# Patient Record
Sex: Female | Born: 1985 | Race: Black or African American | Hispanic: No | Marital: Single | Smoking: Never smoker
Health system: Southern US, Community
[De-identification: ages and names within clinical notes are randomized; demographics above are authoritative.]

## PROBLEM LIST (undated history)

## (undated) HISTORY — PX: CHOLECYSTECTOMY: SHX55

---

## 2007-02-20 ENCOUNTER — Emergency Department: Payer: Self-pay | Admitting: Emergency Medicine

## 2009-02-01 ENCOUNTER — Emergency Department: Payer: Self-pay | Admitting: Emergency Medicine

## 2010-07-23 ENCOUNTER — Emergency Department: Payer: Self-pay | Admitting: Emergency Medicine

## 2010-08-02 ENCOUNTER — Emergency Department: Payer: Self-pay | Admitting: Emergency Medicine

## 2010-08-05 ENCOUNTER — Emergency Department: Payer: Self-pay | Admitting: Emergency Medicine

## 2012-07-28 ENCOUNTER — Ambulatory Visit: Payer: Self-pay

## 2012-09-08 ENCOUNTER — Ambulatory Visit: Payer: Self-pay | Admitting: Gynecologic Oncology

## 2012-09-08 LAB — HCG, QUANTITATIVE, PREGNANCY: Beta Hcg, Quant.: <1 m[IU]/mL — ABNORMAL LOW

## 2012-09-11 LAB — PATHOLOGY REPORT

## 2012-09-18 ENCOUNTER — Ambulatory Visit: Payer: Self-pay | Admitting: Gynecologic Oncology

## 2012-10-18 ENCOUNTER — Ambulatory Visit: Payer: Self-pay | Admitting: Gynecologic Oncology

## 2012-10-27 ENCOUNTER — Ambulatory Visit: Payer: Self-pay | Admitting: Gynecologic Oncology

## 2012-10-27 LAB — CBC
HCT: 36.3 % (ref 35.0–47.0)
MCHC: 33.1 g/dL (ref 32.0–36.0)
MCV: 89 fL (ref 80–100)
Platelet: 258 10*3/uL (ref 150–440)
RBC: 4.08 10*6/uL (ref 3.80–5.20)
RDW: 12.5 % (ref 11.5–14.5)
WBC: 7.4 10*3/uL (ref 3.6–11.0)

## 2012-11-03 ENCOUNTER — Ambulatory Visit: Payer: Self-pay | Admitting: Gynecologic Oncology

## 2012-11-04 LAB — PATHOLOGY REPORT

## 2012-11-18 ENCOUNTER — Ambulatory Visit: Payer: Self-pay | Admitting: Gynecologic Oncology

## 2012-11-19 LAB — URINALYSIS, COMPLETE
Glucose,UR: NEGATIVE mg/dL (ref 0–75)
Ketone: NEGATIVE
Ph: 6 (ref 4.5–8.0)
Protein: NEGATIVE
RBC,UR: 2 /HPF (ref 0–5)
Specific Gravity: 1.026 (ref 1.003–1.030)
Squamous Epithelial: 2

## 2012-11-21 LAB — URINE CULTURE

## 2012-12-19 ENCOUNTER — Ambulatory Visit: Payer: Self-pay | Admitting: Gynecologic Oncology

## 2013-01-16 ENCOUNTER — Ambulatory Visit: Payer: Self-pay | Admitting: Gynecologic Oncology

## 2013-07-01 ENCOUNTER — Ambulatory Visit: Payer: Self-pay | Admitting: Gynecologic Oncology

## 2013-07-19 ENCOUNTER — Ambulatory Visit: Payer: Self-pay | Admitting: Gynecologic Oncology

## 2014-02-07 ENCOUNTER — Emergency Department: Payer: Self-pay | Admitting: Emergency Medicine

## 2014-02-07 LAB — CBC WITH DIFFERENTIAL/PLATELET
BASOS ABS: 0.1 10*3/uL (ref 0.0–0.1)
Basophil %: 1 %
EOS ABS: 0.1 10*3/uL (ref 0.0–0.7)
Eosinophil %: 1.3 %
HCT: 41 % (ref 35.0–47.0)
HGB: 13.7 g/dL (ref 12.0–16.0)
LYMPHS ABS: 2.7 10*3/uL (ref 1.0–3.6)
LYMPHS PCT: 32.9 %
MCH: 29.8 pg (ref 26.0–34.0)
MCHC: 33.5 g/dL (ref 32.0–36.0)
MCV: 89 fL (ref 80–100)
MONO ABS: 0.4 x10 3/mm (ref 0.2–0.9)
Monocyte %: 4.7 %
NEUTROS ABS: 4.9 10*3/uL (ref 1.4–6.5)
Neutrophil %: 60.1 %
PLATELETS: 248 10*3/uL (ref 150–440)
RBC: 4.61 10*6/uL (ref 3.80–5.20)
RDW: 13 % (ref 11.5–14.5)
WBC: 8.2 10*3/uL (ref 3.6–11.0)

## 2014-02-07 LAB — COMPREHENSIVE METABOLIC PANEL
ALK PHOS: 70 U/L
Albumin: 4.1 g/dL (ref 3.4–5.0)
Anion Gap: 7 (ref 7–16)
BUN: 9 mg/dL (ref 7–18)
Bilirubin,Total: 0.4 mg/dL (ref 0.2–1.0)
Calcium, Total: 9 mg/dL (ref 8.5–10.1)
Chloride: 105 mmol/L (ref 98–107)
Co2: 25 mmol/L (ref 21–32)
Creatinine: 0.67 mg/dL (ref 0.60–1.30)
Glucose: 120 mg/dL — ABNORMAL HIGH (ref 65–99)
Osmolality: 274 (ref 275–301)
POTASSIUM: 3.6 mmol/L (ref 3.5–5.1)
SGOT(AST): 17 U/L (ref 15–37)
SGPT (ALT): 21 U/L (ref 12–78)
Sodium: 137 mmol/L (ref 136–145)
Total Protein: 8.2 g/dL (ref 6.4–8.2)

## 2014-02-07 LAB — LIPASE, BLOOD: Lipase: 100 U/L (ref 73–393)

## 2014-09-19 ENCOUNTER — Emergency Department: Payer: Self-pay | Admitting: Emergency Medicine

## 2014-09-19 LAB — URINALYSIS, COMPLETE
BILIRUBIN, UR: NEGATIVE
BLOOD: NEGATIVE
Bilirubin,UR: NEGATIVE
Blood: NEGATIVE
Glucose,UR: NEGATIVE mg/dL (ref 0–75)
Glucose,UR: NEGATIVE mg/dL (ref 0–75)
KETONE: NEGATIVE
KETONE: NEGATIVE
NITRITE: NEGATIVE
Nitrite: NEGATIVE
PROTEIN: NEGATIVE
Ph: 6 (ref 4.5–8.0)
Ph: 5 (ref 4.5–8.0)
Protein: NEGATIVE
RBC,UR: 5 /HPF (ref 0–5)
RBC,UR: 2 /HPF (ref 0–5)
SPECIFIC GRAVITY: 1.008 (ref 1.003–1.030)
Specific Gravity: 1.006 (ref 1.003–1.030)
Squamous Epithelial: 12

## 2014-09-19 LAB — BASIC METABOLIC PANEL
ANION GAP: 8 (ref 7–16)
BUN: 9 mg/dL (ref 7–18)
CALCIUM: 9 mg/dL (ref 8.5–10.1)
CREATININE: 0.69 mg/dL (ref 0.60–1.30)
Chloride: 105 mmol/L (ref 98–107)
Co2: 28 mmol/L (ref 21–32)
EGFR (African American): >60
GLUCOSE: 96 mg/dL (ref 65–99)
OSMOLALITY: 280 (ref 275–301)
Potassium: 3.7 mmol/L (ref 3.5–5.1)
Sodium: 141 mmol/L (ref 136–145)

## 2014-09-19 LAB — CBC
HCT: 38.4 % (ref 35.0–47.0)
HGB: 12.3 g/dL (ref 12.0–16.0)
MCH: 28.5 pg (ref 26.0–34.0)
MCHC: 32 g/dL (ref 32.0–36.0)
MCV: 89 fL (ref 80–100)
PLATELETS: 275 10*3/uL (ref 150–440)
RBC: 4.3 10*6/uL (ref 3.80–5.20)
RDW: 13.2 % (ref 11.5–14.5)
WBC: 10.3 10*3/uL (ref 3.6–11.0)

## 2014-09-21 LAB — URINE CULTURE

## 2015-03-07 NOTE — Op Note (Signed)
PATIENT NAME:  Sarah Mooney, Sarah Mooney MR#:  [id number] DATE OF BIRTH:  1986-09-18  DATE OF PROCEDURE:  11/03/2012  SURGEON:  Dr. Hyacinth MeekerMiller.   PREOPERATIVE DIAGNOSIS:  CIN 3 of exocervix.   POSTOPERATIVE DIAGNOSIS:  CIN 3 of the exocervix.  PROCEDURE PERFORMED:  Loop electrosurgical excision procedure.  INDICATION FOR SURGERY:  The patient is a 29 year old patient who presented with a history of abnormal Pap smear and on a colposcopy-directed biopsy, was noted to have severe dysplasia of the exocervix. The endocervical curettage was negative. Therefore, a decision was made to proceed with an LEEP procedure. The remainder of the pelvic exam had been unremarkable.   FINDINGS AT TIME OF SURGERY:  Normal external genitalia, urethral meatus, urethra, bladder and vagina. Cervix with a central iodine-negative area extending a little bit to the posterior lip. Remainder of the pelvic exam unremarkable.   OPERATIVE REPORT:  After adequate general anesthesia had been obtained, the patient was prepped and draped in high lithotomy position. A speculum was inserted and the cervix was visualized. Lugol's staining was obtained. Then, using the medium-sized loop, the central portion of the cervix with the transformation zone was removed. One small bleeding vessel on the posterior lip was cauterized.   The patient tolerated the procedure well and was taken to recovery room in stable condition. Pad, sponge, needle and instrument count were correct x2.     ____________________________ Maxine GlennBrigitte E. Alee Gressman, MD bem:es D: 11/03/2012 16:42:15 ET T: 11/04/2012 09:59:55 ET JOB#: [id number]  cc: Maxine GlennBrigitte E. Sierria Bruney, MD, <Dictator> Maxine GlennBRIGITTE E Marli Diego MD ELECTRONICALLY SIGNED 11/10/2012 10:02

## 2015-10-27 DIAGNOSIS — F419 Anxiety disorder, unspecified: Secondary | ICD-10-CM | POA: Insufficient documentation

## 2015-10-27 DIAGNOSIS — Z8742 Personal history of other diseases of the female genital tract: Secondary | ICD-10-CM | POA: Insufficient documentation

## 2015-10-27 DIAGNOSIS — E669 Obesity, unspecified: Secondary | ICD-10-CM | POA: Insufficient documentation

## 2017-04-20 ENCOUNTER — Emergency Department
Admission: EM | Admit: 2017-04-20 | Discharge: 2017-04-20 | Disposition: A | Discharge status: Home / Self Care | Payer: Self-pay | Attending: Emergency Medicine | Admitting: Emergency Medicine

## 2017-04-20 DIAGNOSIS — J029 Acute pharyngitis, unspecified: Secondary | ICD-10-CM | POA: Insufficient documentation

## 2017-04-20 LAB — POCT RAPID STREP A: Streptococcus, Group A Screen (Direct): NEGATIVE

## 2017-04-20 MED ORDER — IBUPROFEN 800 MG PO TABS: 800.0000 mg | ORAL_TABLET | Freq: Three times a day (TID) | ORAL | 0 refills | Status: DC | PRN

## 2017-04-20 MED ORDER — LIDOCAINE VISCOUS 2 % MT SOLN
15.0000 mL | Freq: Once | OROMUCOSAL | Status: AC
  Administered 2017-04-20: 15 mL via OROMUCOSAL
  Filled 2017-04-20: qty 15

## 2017-04-20 MED ORDER — MAGIC MOUTHWASH: 15.0000 mL | Freq: Three times a day (TID) | ORAL | 0 refills | Status: DC | PRN

## 2017-04-20 NOTE — Discharge Instructions (Signed)
Please return to the emergency department if you develop severe pain, fever, severe headache, drooling or shortness of breath, or any other symptoms concerning to you.

## 2017-04-20 NOTE — ED Provider Notes (Signed)
[facility] Emergency Department Provider Note  ____________________________________________  Time seen: Approximately 7:57 PM  I have reviewed the triage vital signs and the nursing notes.   HISTORY  Chief Complaint Sore Throat    HPI Sarah Mooney is a 31 y.o. female, otherwise healthy, presenting with sore throat since Tuesday. The patient has pain with swallowing but is able to swallow her saliva. She has not had any associated cough, stiff neck or neck pain, rhinorrhea or congestion, ear pain, fever or chills. She has tried Chloraseptic and Tylenol with minimal relief. No shortness breath.    No past medical history on file.  There are no active problems to display for this patient.   No past surgical history on file.    Allergies Latex  No family history on file.  Social History Social History  Substance Use Topics  . Smoking status: Not on file  . Smokeless tobacco: Not on file  . Alcohol use Not on file    Review of Systems Constitutional: No fever/chills.No lightheadedness or syncope. Eyes: No visual changes. No eye discharge for ENT: Positive sore throat without drooling or shortness of breath. No congestion or rhinorrhea. No ear pain. Cardiovascular: Denies chest pain. Denies palpitations. Respiratory: Denies shortness of breath.  No cough. Gastrointestinal: No abdominal pain.  No nausea, no vomiting.  No diarrhea.  No constipation. Genitourinary: Negative for dysuria. Musculoskeletal: Negative for back pain. Skin: Negative for rash. Neurological: Negative for headaches. No focal numbness, tingling or weakness.     ____________________________________________   PHYSICAL EXAM:  VITAL SIGNS: ED Triage Vitals  Enc Vitals Group     BP 04/20/17 1847 133/79     Pulse Rate 04/20/17 1847 92     Resp 04/20/17 1847 16     Temp 04/20/17 1847 98.6 F (37 C)     Temp Source 04/20/17 1847 Oral     SpO2 04/20/17 1847 100 %     Weight 04/20/17 1847 180 lb (81.6 kg)     Height 04/20/17 1847 5\' 3"  (1.6 m)     Head Circumference --      Peak Flow --      Pain Score 04/20/17 1849 9     Pain Loc --      Pain Edu? --      Excl. in GC? --     Constitutional: Alert and oriented. Well appearing and in no acute distress. Answers questions appropriately. Eyes: Conjunctivae are normal.  EOMI. No scleral icterus.No eye discharge. EARS: TMs are clear without erythema bulge or fluid bilaterally. The canals are clear as well. Head: Atraumatic. Nose: No congestion/rhinnorhea. Mouth/Throat: Mucous membranes are moist. Minimal posterior pharyngeal erythema. Posterior palate is symmetric and uvula is midline. Tonsils are not enlarged, no exudate. Neck: No stridor.  Supple.  No JVD. No meningismus. Cardiovascular: Normal rate, regular rhythm. No murmurs, rubs or gallops.  Respiratory: Normal respiratory effort.  No accessory muscle use or retractions. Lungs CTAB.  No wheezes, rales or ronchi. Gastrointestinal: Soft, nontender and nondistended.  No guarding or rebound.  No peritoneal signs. Musculoskeletal: No LE edema.  Neurologic:  A&Ox3.  Speech is clear.  Face and smile are symmetric.  EOMI.  Moves all extremities well. Skin:  Skin is warm, dry and intact. No rash noted. Psychiatric: Mood and affect are normal. Speech and behavior are normal.  Normal judgement.  ____________________________________________   LABS (all labs ordered are listed, but only abnormal results are displayed)  Labs Reviewed  CULTURE, GROUP A STREP [facility])  POCT RAPID STREP A   ____________________________________________  EKG  Not indicated ____________________________________________  RADIOLOGY  No results found.  ____________________________________________   PROCEDURES  Procedure(s) performed: None  Procedures  Critical Care performed: No ____________________________________________   INITIAL IMPRESSION / ASSESSMENT AND  PLAN / ED COURSE  Pertinent labs & imaging results that were available during my care of the patient were reviewed by me and considered in my medical decision making (see chart for details).  31 y.o. female, otherwise healthy, presenting with 5 days of sore throat without any other cystitis symptoms. The patient has a negative strep test. On my examination, I do see some mild erythema, which is consistent with a pharyngitis, likely viral. I do not see any evidence of tonsillar abscess or peritonsillar abscess, and there is no clinical evidence for epiglottitis, tracheitis, meningitis, or other more severe infection or process today. There is no evidence of airway obstruction. I'll plan to treat the patient symptomatically and have her follow-up with her primary care physician. Return precautions as well as follow-up instructions were discussed.  ____________________________________________  FINAL CLINICAL IMPRESSION(S) / ED DIAGNOSES  Final diagnoses:  Pharyngitis, unspecified etiology         NEW MEDICATIONS STARTED DURING THIS VISIT:  New Prescriptions   IBUPROFEN (ADVIL,MOTRIN) 800 MG TABLET    Take 1 tablet (800 mg total) by mouth every 8 (eight) hours as needed.   MAGIC MOUTHWASH SOLN    Take 15 mLs by mouth 3 (three) times daily as needed for mouth pain.      Rockne Menghini, MD 04/20/17 2003

## 2017-04-20 NOTE — ED Triage Notes (Signed)
Pt reports sore throat since Tuesday, states pain cont to get worse, hurts to swallow

## 2017-04-24 LAB — CULTURE, GROUP A STREP (THRC)

## 2017-10-22 LAB — HM HIV SCREENING LAB: HM HIV Screening: NEGATIVE

## 2017-10-22 LAB — HM PAP SMEAR: HM Pap smear: NEGATIVE

## 2018-02-23 ENCOUNTER — Other Ambulatory Visit: Payer: Self-pay

## 2018-02-23 ENCOUNTER — Encounter: Payer: Self-pay | Admitting: *Deleted

## 2018-02-23 ENCOUNTER — Emergency Department
Admission: EM | Admit: 2018-02-23 | Discharge: 2018-02-23 | Disposition: A | Discharge status: Home / Self Care | Payer: Self-pay | Attending: Emergency Medicine | Admitting: Emergency Medicine

## 2018-02-23 DIAGNOSIS — Z79899 Other long term (current) drug therapy: Secondary | ICD-10-CM | POA: Insufficient documentation

## 2018-02-23 DIAGNOSIS — R55 Syncope and collapse: Secondary | ICD-10-CM | POA: Insufficient documentation

## 2018-02-23 DIAGNOSIS — Z91040 Latex allergy status: Secondary | ICD-10-CM | POA: Insufficient documentation

## 2018-02-23 DIAGNOSIS — R112 Nausea with vomiting, unspecified: Secondary | ICD-10-CM | POA: Insufficient documentation

## 2018-02-23 DIAGNOSIS — R197 Diarrhea, unspecified: Secondary | ICD-10-CM

## 2018-02-23 LAB — CBC
HEMATOCRIT: 42 % (ref 35.0–47.0)
HEMOGLOBIN: 13.4 g/dL (ref 12.0–16.0)
MCH: 27.9 pg (ref 26.0–34.0)
MCHC: 31.9 g/dL — ABNORMAL LOW (ref 32.0–36.0)
MCV: 87.5 fL (ref 80.0–100.0)
Platelets: 330 10*3/uL (ref 150–440)
RBC: 4.8 MIL/uL (ref 3.80–5.20)
RDW: 13 % (ref 11.5–14.5)
WBC: 9.4 10*3/uL (ref 3.6–11.0)

## 2018-02-23 LAB — URINALYSIS, COMPLETE (UACMP) WITH MICROSCOPIC
BACTERIA UA: NONE SEEN
Bilirubin Urine: NEGATIVE
GLUCOSE, UA: NEGATIVE mg/dL
Ketones, ur: 5 mg/dL — AB
LEUKOCYTES UA: NEGATIVE
NITRITE: NEGATIVE
Protein, ur: NEGATIVE mg/dL
SPECIFIC GRAVITY, URINE: 1.024 (ref 1.005–1.030)
pH: 5 (ref 5.0–8.0)

## 2018-02-23 LAB — COMPREHENSIVE METABOLIC PANEL
ALT: 17 U/L (ref 14–54)
AST: 17 U/L (ref 15–41)
Albumin: 4.6 g/dL (ref 3.5–5.0)
Alkaline Phosphatase: 72 U/L (ref 38–126)
Anion gap: 7 (ref 5–15)
BILIRUBIN TOTAL: 0.7 mg/dL (ref 0.3–1.2)
BUN: 9 mg/dL (ref 6–20)
CO2: 28 mmol/L (ref 22–32)
Calcium: 9.7 mg/dL (ref 8.9–10.3)
Chloride: 104 mmol/L (ref 101–111)
Creatinine, Ser: 0.74 mg/dL (ref 0.44–1.00)
GFR calc Af Amer: >60 mL/min (ref >60)
Glucose, Bld: 91 mg/dL (ref 65–99)
POTASSIUM: 3.4 mmol/L — AB (ref 3.5–5.1)
Sodium: 139 mmol/L (ref 135–145)
TOTAL PROTEIN: 8.4 g/dL — AB (ref 6.5–8.1)

## 2018-02-23 LAB — LIPASE, BLOOD: Lipase: 24 U/L (ref 11–51)

## 2018-02-23 MED ORDER — ONDANSETRON 4 MG PO TBDP: 4.0000 mg | ORAL_TABLET | Freq: Once | ORAL | Status: DC | PRN

## 2018-02-23 MED ORDER — IBUPROFEN 400 MG PO TABS
400.0000 mg | ORAL_TABLET | Freq: Once | ORAL | Status: AC | PRN
  Administered 2018-02-23: 400 mg via ORAL
  Filled 2018-02-23: qty 1

## 2018-02-23 MED ORDER — METOCLOPRAMIDE HCL 5 MG/ML IJ SOLN
10.0000 mg | Freq: Once | INTRAMUSCULAR | Status: AC
  Administered 2018-02-23: 10 mg via INTRAMUSCULAR
  Filled 2018-02-23: qty 2

## 2018-02-23 NOTE — ED Provider Notes (Signed)
[facility] Emergency Department Provider Note ____________________________________________   First MD Initiated Contact with Patient 02/23/18 1659     (approximate)  I have reviewed the triage vital signs and the nursing notes.   HISTORY  Chief Complaint Emesis and Headache    HPI Sarah Mooney is a 32 y.o. female who presents with multiple complaints, but primarily describing an episode of near syncope last night.  Patient states that she initially started having diarrhea yesterday evening, and then felt discomfort in her stomach.  She went to the bathroom again, and put her head and arms on the sink while she was on the toilet.  Patient states that she felt very lightheaded and weak, and felt like she was going to pass out.  She states that after this she had an episode of vomiting, and then developed urticaria which are now resolved after Benadryl.  Patient states that the nausea and vomiting have resolved, she has some abdominal discomfort but no pain, and no diarrhea today.  She primarily reports a frontal throbbing headache.  No sick contacts, recent illness, or antibiotic use.  No unusual foods, but the patient states that the symptoms started after eating pizza.   History reviewed. No pertinent past medical history.  There are no active problems to display for this patient.   History reviewed. No pertinent surgical history.  Prior to Admission medications   Medication Sig Start Date End Date Taking? Authorizing Provider  ibuprofen (ADVIL,MOTRIN) 800 MG tablet Take 1 tablet (800 mg total) by mouth every 8 (eight) hours as needed. 04/20/17   Rockne Menghini, MD  magic mouthwash SOLN Take 15 mLs by mouth 3 (three) times daily as needed for mouth pain. 04/20/17   Rockne Menghini, MD    Allergies Latex  History reviewed. No pertinent family history.  Social History Social History   Tobacco Use  . Smoking status: Never Smoker  .  Smokeless tobacco: Never Used  Substance Use Topics  . Alcohol use: Not Currently  . Drug use: Not Currently    Review of Systems  Constitutional: No fever. Eyes: No redness. ENT: No sore throat. Cardiovascular: Denies chest pain. Respiratory: Denies shortness of breath. Gastrointestinal: Positive for resolved diarrhea.  Genitourinary: Negative for dysuria.  Musculoskeletal: Negative for back pain. Skin: Positive for urticaria. Neurological: Positive for headache.   ____________________________________________   PHYSICAL EXAM:  VITAL SIGNS: ED Triage Vitals  Enc Vitals Group     BP 02/23/18 1609 118/72     Pulse Rate 02/23/18 1609 92     Resp 02/23/18 1609 20     Temp 02/23/18 1609 98.6 F (37 C)     Temp Source 02/23/18 1609 Oral     SpO2 02/23/18 1609 98 %     Weight 02/23/18 1612 200 lb (90.7 kg)     Height 02/23/18 1612 5\' 2"  (1.575 m)     Head Circumference --      Peak Flow --      Pain Score 02/23/18 1609 9     Pain Loc --      Pain Edu? --      Excl. in GC? --     Constitutional: Alert and oriented. Well appearing and in no acute distress. Eyes: Conjunctivae are normal.  EOMI.  PERRLA. Head: Atraumatic. Nose: No congestion/rhinnorhea. Mouth/Throat: Mucous membranes are moist.   Neck: Normal range of motion.  No meningeal signs. Cardiovascular: Normal rate, regular rhythm. Grossly normal heart sounds.  Good peripheral  circulation. Respiratory: Normal respiratory effort.  No retractions. Lungs CTAB. Gastrointestinal: Soft and nontender. No distention.  Genitourinary: No flank tenderness. Musculoskeletal:  Extremities warm and well perfused.  Neurologic:  Normal speech and language. No gross focal neurologic deficits are appreciated.  Skin:  Skin is warm and dry. No rash noted. Psychiatric: Mood and affect are normal. Speech and behavior are normal.  ____________________________________________   LABS (all labs ordered are listed, but only abnormal  results are displayed)  Labs Reviewed  COMPREHENSIVE METABOLIC PANEL - Abnormal; Notable for the following components:      Result Value   Potassium 3.4 (*)    Total Protein 8.4 (*)    All other components within normal limits  CBC - Abnormal; Notable for the following components:   MCHC 31.9 (*)    All other components within normal limits  URINALYSIS, COMPLETE (UACMP) WITH MICROSCOPIC - Abnormal; Notable for the following components:   Color, Urine YELLOW (*)    APPearance CLOUDY (*)    Hgb urine dipstick SMALL (*)    Ketones, ur 5 (*)    Squamous Epithelial / LPF 0-5 (*)    All other components within normal limits  LIPASE, BLOOD  POC URINE PREG, ED   ____________________________________________  EKG  ED ECG REPORT I, Dionne BucySebastian Rosemary Mossbarger, the attending physician, personally viewed and interpreted this ECG.  Date: 02/23/2018 EKG Time: 1801 Rate: 61 Rhythm: normal sinus rhythm QRS Axis: normal Intervals: normal ST/T Wave abnormalities: normal Narrative Interpretation: no evidence of acute ischemia  ____________________________________________  RADIOLOGY    ____________________________________________   PROCEDURES  Procedure(s) performed: No  Procedures  Critical Care performed: No ____________________________________________   INITIAL IMPRESSION / ASSESSMENT AND PLAN / ED COURSE  Pertinent labs & imaging results that were available during my care of the patient were reviewed by me and considered in my medical decision making (see chart for details).  32 year old female presents with vomiting and diarrhea last night, with an associated episode of near syncope and hives which have since resolved.  Patient reports a frontal headache at this time, and denies other symptoms.  On exam, the patient is very well-appearing, vital signs are normal, and the remainder the exam is as described above.  Neuro exam is nonfocal, abdomen is soft and nontender, and the  mucous membranes are moist.  Presentation is consistent with vasovagal near syncope related to either gastroenteritis or foodborne illness.  The headache was likely precipitated by the syncope, but differential also includes mild dehydration or migraine.   Initial labs are reassuring.  We will obtain an EKG and give IM reglan for symptomatic treatment.    ----------------------------------------- 6:26 PM on 02/23/2018 -----------------------------------------  EKG is within normal limits.  Patient reports feeling slightly better after the Reglan.  She feels well to go home.  Return precautions given, and the patient expresses understanding.  ____________________________________________   FINAL CLINICAL IMPRESSION(S) / ED DIAGNOSES  Final diagnoses:  Near syncope  Nausea vomiting and diarrhea      NEW MEDICATIONS STARTED DURING THIS VISIT:  New Prescriptions   No medications on file     Note:  This document was prepared using Dragon voice recognition software and may include unintentional dictation errors.    Dionne BucySiadecki, Dunia Pringle, MD 02/23/18 [phone]

## 2018-02-23 NOTE — ED Notes (Signed)
Pt reports hives, diarrhea yesterday.  Pt took a benadryl with good results yesterday.  No hives today.  Pt has a headache today.  No n/v/  No diarrhea today.  Pt drinking water now.  Pt alert.  Speech clear.

## 2018-02-23 NOTE — ED Triage Notes (Signed)
Pt states vomiting starting 2100 yesterday. Pt states she vomited x 4. Pt states diarrhea x 2 prior to beginning vomiting. Pt c/o headache and abdominal pain, presently. Has had no recurrence of diarrhea or vomiting. Pt states able to keep water down, but she still feels dry-mouthed. Pt denies fever and dysuria.

## 2018-02-23 NOTE — Discharge Instructions (Addendum)
Drink plenty of fluids and start to gradually advance your diet over the next few days.  Return to the ER for new, worsening, or persistent severe headache, vomiting, fevers, weakness, or any other new or worsening symptoms that concern you.  Follow-up with your primary care doctor.

## 2018-02-23 NOTE — ED Notes (Signed)
POCT preg test negative.

## 2018-12-27 DIAGNOSIS — J029 Acute pharyngitis, unspecified: Secondary | ICD-10-CM | POA: Insufficient documentation

## 2018-12-27 DIAGNOSIS — Z91040 Latex allergy status: Secondary | ICD-10-CM | POA: Insufficient documentation

## 2018-12-28 ENCOUNTER — Emergency Department
Admission: EM | Admit: 2018-12-28 | Discharge: 2018-12-28 | Disposition: A | Discharge status: Home / Self Care | Payer: Self-pay | Attending: Emergency Medicine | Admitting: Emergency Medicine

## 2018-12-28 ENCOUNTER — Other Ambulatory Visit: Payer: Self-pay

## 2018-12-28 DIAGNOSIS — J029 Acute pharyngitis, unspecified: Secondary | ICD-10-CM

## 2018-12-28 LAB — GROUP A STREP BY PCR: GROUP A STREP BY PCR: NOT DETECTED

## 2018-12-28 MED ORDER — DEXAMETHASONE 4 MG PO TABS
12.0000 mg | ORAL_TABLET | Freq: Once | ORAL | Status: AC
  Administered 2018-12-28: 12 mg via ORAL
  Filled 2018-12-28: qty 3

## 2018-12-28 MED ORDER — IBUPROFEN 600 MG PO TABS: 600.0000 mg | ORAL_TABLET | Freq: Four times a day (QID) | ORAL | 0 refills | Status: DC | PRN

## 2018-12-28 NOTE — ED Provider Notes (Signed)
[facility] Emergency Department Provider Note ____________________________________________   First MD Initiated Contact with Patient 12/28/18 0236     (approximate)  I have reviewed the triage vital signs and the nursing notes.   HISTORY  Chief Complaint Sore Throat    HPI Sarah Mooney is a 33 y.o. female with no significant PMH who presents with sore throat for 1 week, gradual onset, persistent course, not relieved by Robitussin at home.  She reports associated nasal congestion and a subjective fever but no cough.  She reports pain on swallowing but no difficulty swallowing.  No past medical history on file.  There are no active problems to display for this patient.   No past surgical history on file.  Prior to Admission medications   Medication Sig Start Date End Date Taking? Authorizing Provider  ibuprofen (ADVIL,MOTRIN) 600 MG tablet Take 1 tablet (600 mg total) by mouth every 6 (six) hours as needed. 12/28/18   Dionne Bucy, MD  magic mouthwash SOLN Take 15 mLs by mouth 3 (three) times daily as needed for mouth pain. 04/20/17   Rockne Menghini, MD    Allergies Latex  No family history on file.  Social History Social History   Tobacco Use  . Smoking status: Never Smoker  . Smokeless tobacco: Never Used  Substance Use Topics  . Alcohol use: Not Currently  . Drug use: Not Currently    Review of Systems  Constitutional: Positive for fever. Eyes: No redness. ENT: Positive for sore throat. Cardiovascular: Denies chest pain. Respiratory: Denies shortness of breath. Gastrointestinal: No vomiting or diarrhea.  Genitourinary: Negative for flank pain.  Musculoskeletal: Negative for back pain. Skin: Negative for rash. Neurological: Negative for headache.   ____________________________________________   PHYSICAL EXAM:  VITAL SIGNS: ED Triage Vitals [12/28/18 0001]  Enc Vitals Group     BP 123/78     Pulse Rate 79       Resp 16     Temp 98.4 F (36.9 C)     Temp Source Oral     SpO2 100 %     Weight 200 lb (90.7 kg)     Height 5' (1.524 m)     Head Circumference      Peak Flow      Pain Score 10     Pain Loc      Pain Edu?      Excl. in GC?     Constitutional: Alert and oriented. Well appearing and in no acute distress. Eyes: Conjunctivae are normal.  Head: Atraumatic. Nose: No congestion/rhinnorhea. Mouth/Throat: Mucous membranes are moist.  Tonsils slightly swollen and erythematous with no exudate.  No stridor. Neck: Normal range of motion.  No significant lymphadenopathy. Cardiovascular:  Good peripheral circulation. Respiratory: Normal respiratory effort.   Gastrointestinal: No distention.  Musculoskeletal: Extremities warm and well perfused.  Neurologic:  Normal speech and language. No gross focal neurologic deficits are appreciated.  Skin:  Skin is warm and dry. No rash noted. Psychiatric: Mood and affect are normal. Speech and behavior are normal.  ____________________________________________   LABS (all labs ordered are listed, but only abnormal results are displayed)  Labs Reviewed  GROUP A STREP BY PCR   ____________________________________________  EKG   ____________________________________________  RADIOLOGY    ____________________________________________   PROCEDURES  Procedure(s) performed: No  Procedures  Critical Care performed: No ____________________________________________   INITIAL IMPRESSION / ASSESSMENT AND PLAN / ED COURSE  Pertinent labs & imaging results that were available during  my care of the patient were reviewed by me and considered in my medical decision making (see chart for details).  33 year old female with no significant PMH presents with sore throat for the last week.  Presentation is consistent with viral pharyngitis.  She is afebrile here.  Strep swab is negative.  We will treat symptomatically with Decadron and ibuprofen.   Return precautions given, and the patient expressed understanding.  She is stable for discharge home.     ____________________________________________   FINAL CLINICAL IMPRESSION(S) / ED DIAGNOSES  Final diagnoses:  Viral pharyngitis      NEW MEDICATIONS STARTED DURING THIS VISIT:  New Prescriptions   IBUPROFEN (ADVIL,MOTRIN) 600 MG TABLET    Take 1 tablet (600 mg total) by mouth every 6 (six) hours as needed.     Note:  This document was prepared using Dragon voice recognition software and may include unintentional dictation errors.    Dionne BucySiadecki, Atisha Hamidi, MD 12/28/18 [phone]

## 2018-12-28 NOTE — ED Notes (Signed)
Pt reports feeling like a needle is in her throat every time she swallows x 1 week; pt says she felt hot but never checked her temp; pt talking in complete sentences; in no acute distress

## 2018-12-28 NOTE — ED Triage Notes (Signed)
Pt with sore throat for a week. Pt with red swollen tonsils noted. Pt appears in no acute distress.

## 2018-12-28 NOTE — Discharge Instructions (Signed)
The dexamethasone you got in the ER today will help decrease the inflammation and pain around her tonsils.  You can take the prescribed ibuprofen every 6 hours, or if you already have ibuprofen (Advil, Motrin) at home you can take the over-the-counter version, 600 mg every 6 hours.  Return to the ER for new or worsening throat pain, difficulty swallowing, shortness of breath, or any other new or worsening symptoms that concern you.

## 2019-08-02 DIAGNOSIS — Z6833 Body mass index (BMI) 33.0-33.9, adult: Secondary | ICD-10-CM

## 2019-08-02 DIAGNOSIS — E669 Obesity, unspecified: Secondary | ICD-10-CM

## 2019-08-02 DIAGNOSIS — Z8742 Personal history of other diseases of the female genital tract: Secondary | ICD-10-CM

## 2019-08-03 ENCOUNTER — Other Ambulatory Visit: Payer: Self-pay

## 2019-08-03 ENCOUNTER — Encounter: Payer: Self-pay | Admitting: Family Medicine

## 2019-08-03 ENCOUNTER — Ambulatory Visit: Payer: Self-pay | Admitting: Family Medicine

## 2019-08-03 DIAGNOSIS — Z113 Encounter for screening for infections with a predominantly sexual mode of transmission: Secondary | ICD-10-CM

## 2019-08-03 LAB — WET PREP FOR TRICH, YEAST, CLUE
Clue Cell Exam: POSITIVE — AB
Trichomonas Exam: POSITIVE — AB
Yeast Exam: NEGATIVE

## 2019-08-03 MED ORDER — METRONIDAZOLE 500 MG PO TABS: 500.0000 mg | ORAL_TABLET | Freq: Two times a day (BID) | ORAL | 0 refills | Status: AC

## 2019-08-03 NOTE — Progress Notes (Signed)
Wet mount reviewed with provider. Patient treated for BV and Trich.Jenetta Downer, RN

## 2019-08-03 NOTE — Progress Notes (Signed)
Here today for STD screening. Declines bloodwork. Wesly Whisenant, RN ? ?

## 2019-08-03 NOTE — Progress Notes (Signed)
    STI clinic/screening visit  Subjective:  Sarah Mooney is a 33 y.o. female being seen today for an STI screening visit. The patient reports they do have symptoms.  Patient has the following medical conditions:   Patient Active Problem List   Diagnosis Date Noted  . Anxiety disorder 10/27/2015  . Obesity, unspecified 10/27/2015  . History of abnormal cervical Pap smear 10/27/2015     Chief Complaint  Patient presents with  . SEXUALLY TRANSMITTED DISEASE    HPI  Patient reports she has been experiencing a yellowish discharge with itching and irritation for the last week. States she did notice a weird odor soon after she noticed discharge. Denies pelvic pain or abnormal bleeding. Has not had sexual intercourse, but only oral sex 2 weeks ago.  See flowsheet for further details and programmatic requirements.    The following portions of the patient's history were reviewed and updated as appropriate: allergies, current medications, past medical history, past social history, past surgical history and problem list.  Objective:  There were no vitals filed for this visit.  Physical Exam Constitutional:      Appearance: Normal appearance.  HENT:     Mouth/Throat:     Mouth: Mucous membranes are moist.     Pharynx: Oropharynx is clear. No oropharyngeal exudate or posterior oropharyngeal erythema.  Neck:     Musculoskeletal: Normal range of motion and neck supple. No muscular tenderness.  Genitourinary:    General: Normal vulva.     Vagina: Vaginal discharge present.     Rectum: Normal.     Comments: Small amount of yellowish/tan discharge noted.pH<4.5 No cervical motion tenderness upon bimanual exam.  Lymphadenopathy:     Cervical: No cervical adenopathy.  Skin:    General: Skin is warm and dry.  Neurological:     Mental Status: She is alert and oriented to person, place, and time.  Psychiatric:        Mood and Affect: Mood normal.        Behavior: Behavior normal.         Judgment: Judgment normal.       Assessment and Plan:  Sarah Mooney is a 33 y.o. female presenting to the [facility] Department for STI screening  1. Screening examination for venereal disease - WET PREP FOR TRICH, YEAST, CLUE - Chlamydia/Gonorrhea [place] Treat wet prep for BV and Trich with metronidazole 500mg  PO BID x 7days. Counseled that partner will need to be treated as well.  No sexual activity x 7 days.  Oral/throat culture not obtained due to culture plate unavailability.  Interview and examination done by Harlow Mares, FNP student. I was consulted about this client's pan of care. CLatta FNP  No follow-ups on file.  No future appointments.  Hassell Done, FNP

## 2019-08-21 ENCOUNTER — Encounter: Payer: Self-pay | Admitting: Emergency Medicine

## 2019-08-21 ENCOUNTER — Other Ambulatory Visit: Payer: Self-pay

## 2019-08-21 ENCOUNTER — Emergency Department
Admission: EM | Admit: 2019-08-21 | Discharge: 2019-08-21 | Disposition: A | Discharge status: Home / Self Care | Payer: Self-pay | Attending: Student | Admitting: Student

## 2019-08-21 DIAGNOSIS — M79605 Pain in left leg: Secondary | ICD-10-CM

## 2019-08-21 DIAGNOSIS — M79604 Pain in right leg: Secondary | ICD-10-CM | POA: Insufficient documentation

## 2019-08-21 LAB — BASIC METABOLIC PANEL
Anion gap: 7 (ref 5–15)
BUN: 11 mg/dL (ref 6–20)
CO2: 25 mmol/L (ref 22–32)
Calcium: 9.1 mg/dL (ref 8.9–10.3)
Chloride: 105 mmol/L (ref 98–111)
Creatinine, Ser: 0.59 mg/dL (ref 0.44–1.00)
GFR calc Af Amer: >60 mL/min (ref >60)
GFR calc non Af Amer: >60 mL/min (ref >60)
Glucose, Bld: 88 mg/dL (ref 70–99)
Potassium: 3.6 mmol/L (ref 3.5–5.1)
Sodium: 137 mmol/L (ref 135–145)

## 2019-08-21 LAB — CBC WITH DIFFERENTIAL/PLATELET
Abs Immature Granulocytes: 0.03 10*3/uL (ref 0.00–0.07)
Basophils Absolute: 0.1 10*3/uL (ref 0.0–0.1)
Basophils Relative: 1 %
Eosinophils Absolute: 0.1 10*3/uL (ref 0.0–0.5)
Eosinophils Relative: 1 %
HCT: 40.4 % (ref 36.0–46.0)
Hemoglobin: 12.8 g/dL (ref 12.0–15.0)
Immature Granulocytes: 0 %
Lymphocytes Relative: 32 %
Lymphs Abs: 3.4 10*3/uL (ref 0.7–4.0)
MCH: 27.6 pg (ref 26.0–34.0)
MCHC: 31.7 g/dL (ref 30.0–36.0)
MCV: 87.3 fL (ref 80.0–100.0)
Monocytes Absolute: 0.8 10*3/uL (ref 0.1–1.0)
Monocytes Relative: 8 %
Neutro Abs: 6.2 10*3/uL (ref 1.7–7.7)
Neutrophils Relative %: 58 %
Platelets: 328 10*3/uL (ref 150–400)
RBC: 4.63 MIL/uL (ref 3.87–5.11)
RDW: 13.5 % (ref 11.5–15.5)
WBC: 10.6 10*3/uL — ABNORMAL HIGH (ref 4.0–10.5)
nRBC: 0 % (ref 0.0–0.2)

## 2019-08-21 MED ORDER — CYCLOBENZAPRINE HCL 10 MG PO TABS
10.0000 mg | ORAL_TABLET | Freq: Once | ORAL | Status: AC
  Administered 2019-08-21: 10 mg via ORAL
  Filled 2019-08-21: qty 1

## 2019-08-21 MED ORDER — IBUPROFEN 800 MG PO TABS
800.0000 mg | ORAL_TABLET | Freq: Once | ORAL | Status: AC
  Administered 2019-08-21: 800 mg via ORAL
  Filled 2019-08-21: qty 1

## 2019-08-21 MED ORDER — IBUPROFEN 800 MG PO TABS: 800.0000 mg | ORAL_TABLET | Freq: Three times a day (TID) | ORAL | 0 refills | Status: DC | PRN

## 2019-08-21 MED ORDER — CYCLOBENZAPRINE HCL 5 MG PO TABS: 5.0000 mg | ORAL_TABLET | Freq: Three times a day (TID) | ORAL | 0 refills | Status: DC | PRN

## 2019-08-21 NOTE — Discharge Instructions (Signed)
Take the prescription meds as directed. Follow-up with your provider or return to the ED for worsening symptoms, as discussed.

## 2019-08-21 NOTE — ED Triage Notes (Signed)
Bilateral knee pain x 2 weeks. Denies any sports activities or falls.

## 2019-08-22 NOTE — ED Provider Notes (Signed)
[facility] Emergency Department Provider Note ____________________________________________  Time seen: 1825  I have reviewed the triage vital signs and the nursing notes.  HISTORY  Chief Complaint  Knee Pain  HPI Sarah Mooney is a 33 y.o. female patient with complaints of pain at [facility] to the knees but to the anterior thigh of the right leg for the past 2 weeks.  She reports intermittent symptoms but denies any preceding injury, accident, or trauma.  Patient reports muscle pain and achiness to the anterior right thigh as well as some tenderness to the anterior shin.  She localizes pain to the patellofemoral junction on the right.  No frank knee complaints are reported patient denies any history of chronic ongoing knee pain.  She also denies any symptom management or previous evaluation  for complaints.  She is also denied any distal paresthesias, foot drop, incontinence, chest pain, shortness of breath.  She has had no headaches, visual disturbance, or unexplained weight loss.  History reviewed. No pertinent past medical history.  Patient Active Problem List   Diagnosis Date Noted  . Anxiety disorder 10/27/2015  . Obesity, unspecified 10/27/2015  . History of abnormal cervical Pap smear 10/27/2015    History reviewed. No pertinent surgical history.  Prior to Admission medications   Medication Sig Start Date End Date Taking? Authorizing Provider  cyclobenzaprine (FLEXERIL) 5 MG tablet Take 1 tablet (5 mg total) by mouth 3 (three) times daily as needed. 08/21/19   Elvis Laufer, Dannielle Karvonen, PA-C  ibuprofen (ADVIL) 800 MG tablet Take 1 tablet (800 mg total) by mouth every 8 (eight) hours as needed. 08/21/19   Masey Scheiber, Dannielle Karvonen, PA-C    Allergies Latex  Family History  Problem Relation Age of Onset  . Diabetes Mother   . Hypertension Mother   . Heart disease Mother   . Heart attack Father   . Diabetes Father   . Hypertension Father   . Cancer  Maternal Grandmother   . Cancer Maternal Grandfather   . Heart attack Paternal Grandmother     Social History Social History   Tobacco Use  . Smoking status: Never Smoker  . Smokeless tobacco: Never Used  Substance Use Topics  . Alcohol use: Not Currently  . Drug use: Not Currently    Review of Systems  Constitutional: Negative for fever. Eyes: Negative for visual changes. ENT: Negative for sore throat. Cardiovascular: Negative for chest pain. Respiratory: Negative for shortness of breath. Gastrointestinal: Negative for abdominal pain, vomiting and diarrhea. Genitourinary: Negative for dysuria. Musculoskeletal: Negative for back pain.  Right thigh pain as above. Skin: Negative for rash. Neurological: Negative for headaches, focal weakness or numbness. ____________________________________________  PHYSICAL EXAM:  VITAL SIGNS: ED Triage Vitals  Enc Vitals Group     BP 08/21/19 1742 (!) 95/55     Pulse Rate 08/21/19 1742 89     Resp 08/21/19 1742 14     Temp 08/21/19 1742 97.7 F (36.5 C)     Temp Source 08/21/19 1742 Oral     SpO2 08/21/19 1742 97 %     Weight 08/21/19 1750 204 lb (92.5 kg)     Height 08/21/19 1750 5\' 2"  (1.575 m)     Head Circumference --      Peak Flow --      Pain Score 08/21/19 1750 10     Pain Loc --      Pain Edu? --      Excl. in [place]? --  Constitutional: Alert and oriented. Well appearing and in no distress. Head: Normocephalic and atraumatic. Eyes: Conjunctivae are normal. Normal extraocular movements Neck: Supple. Normal ROM without crepitus Cardiovascular: Normal rate, regular rhythm. Normal distal pulses. Respiratory: Normal respiratory effort. No wheezes/rales/rhonchi. Gastrointestinal: Soft and nontender. No distention. Musculoskeletal: Normal spinal alignment without midline tenderness, spasm, deformity, or step-off.  Patient with normal quadricep muscle contraction and normal leg extension.  There is no internal derangement  directed to the right knee.  No popliteal space fullness is noted.  No calf or Achilles and is noted distally.  Ankle exam is benign within normal limits.  Patient is able to demonstrate normal lumbar flexion and extension range on exam.  She has a solid hip and knee flexion range on exam.  Nontender with normal range of motion in all extremities.  Neurologic: Cranial nerves II through XII grossly intact.  Normal LE DTRs bilaterally.  Normal toe and heel walk on exam.  No cerebellar ataxia appreciated.  Normal gait without ataxia. Normal speech and language. No gross focal neurologic deficits are appreciated. Skin:  Skin is warm, dry and intact. No rash noted. ____________________________________________   LABS (pertinent positives/negatives) Labs Reviewed  CBC WITH DIFFERENTIAL/PLATELET - Abnormal; Notable for the following components:      Result Value   WBC 10.6 (*)    All other components within normal limits  BASIC METABOLIC PANEL  ____________________________________________  PROCEDURES  Flexeril 10 mg PO IBU 800 mg PO Procedures ____________________________________________  INITIAL IMPRESSION / ASSESSMENT AND PLAN / ED COURSE  Patient with ED evaluation of anterior right thigh pain over the last 2 weeks.  Patient describes intermittent but persistent muscle pain without injury, swelling, or ecchymosis.  Her exam is reassuring as she is able to demonstrate normal flexion and extension range and no neuro muscle deficits are appreciated.  Her labs also did not reveal any acute infectious or premature processes.  Patient is encouraged to follow-up with primary provider for ongoing symptoms.  Prescriptions for ibuprofen cyclobenzaprine are provided.  She will follow-up as directed or return to the ED as needed.  Sarah Mooney was evaluated in Emergency Department on 08/22/2019 for the symptoms described in the history of present illness. She was evaluated in the context of the global  COVID-19 pandemic, which necessitated consideration that the patient might be at risk for infection with the SARS-CoV-2 virus that causes COVID-19. Institutional protocols and algorithms that pertain to the evaluation of patients at risk for COVID-19 are in a state of rapid change based on information released by regulatory bodies including the CDC and federal and state organizations. These policies and algorithms were followed during the patient's care in the ED. ____________________________________________  FINAL CLINICAL IMPRESSION(S) / ED DIAGNOSES  Final diagnoses:  Musculoskeletal pain of left lower extremity      Atharva Mirsky, Charlesetta Ivory, PA-C 08/22/19 2009    Miguel Aschoff., MD 08/23/19 [phone]

## 2019-09-24 ENCOUNTER — Other Ambulatory Visit: Payer: Self-pay | Admitting: *Deleted

## 2019-09-24 DIAGNOSIS — Z20822 Contact with and (suspected) exposure to covid-19: Secondary | ICD-10-CM

## 2019-09-25 LAB — NOVEL CORONAVIRUS, NAA: SARS-CoV-2, NAA: NOT DETECTED

## 2020-02-08 ENCOUNTER — Encounter: Payer: Self-pay | Admitting: Emergency Medicine

## 2020-02-08 ENCOUNTER — Other Ambulatory Visit: Payer: Self-pay

## 2020-02-08 DIAGNOSIS — N3000 Acute cystitis without hematuria: Secondary | ICD-10-CM | POA: Insufficient documentation

## 2020-02-08 DIAGNOSIS — Z79899 Other long term (current) drug therapy: Secondary | ICD-10-CM | POA: Insufficient documentation

## 2020-02-08 DIAGNOSIS — R000 Tachycardia, unspecified: Secondary | ICD-10-CM | POA: Insufficient documentation

## 2020-02-08 LAB — CBC WITH DIFFERENTIAL/PLATELET
Abs Immature Granulocytes: 0.03 10*3/uL (ref 0.00–0.07)
Basophils Absolute: 0.1 10*3/uL (ref 0.0–0.1)
Basophils Relative: 0 %
Eosinophils Absolute: 0.3 10*3/uL (ref 0.0–0.5)
Eosinophils Relative: 2 %
HCT: 39.9 % (ref 36.0–46.0)
Hemoglobin: 12.7 g/dL (ref 12.0–15.0)
Immature Granulocytes: 0 %
Lymphocytes Relative: 38 %
Lymphs Abs: 4.3 10*3/uL — ABNORMAL HIGH (ref 0.7–4.0)
MCH: 28.1 pg (ref 26.0–34.0)
MCHC: 31.8 g/dL (ref 30.0–36.0)
MCV: 88.3 fL (ref 80.0–100.0)
Monocytes Absolute: 0.6 10*3/uL (ref 0.1–1.0)
Monocytes Relative: 6 %
Neutro Abs: 6 10*3/uL (ref 1.7–7.7)
Neutrophils Relative %: 54 %
Platelets: 326 10*3/uL (ref 150–400)
RBC: 4.52 MIL/uL (ref 3.87–5.11)
RDW: 13.3 % (ref 11.5–15.5)
WBC: 11.4 10*3/uL — ABNORMAL HIGH (ref 4.0–10.5)
nRBC: 0 % (ref 0.0–0.2)

## 2020-02-08 LAB — URINALYSIS, COMPLETE (UACMP) WITH MICROSCOPIC
Bilirubin Urine: NEGATIVE
Glucose, UA: NEGATIVE mg/dL
Hgb urine dipstick: NEGATIVE
Ketones, ur: NEGATIVE mg/dL
Nitrite: POSITIVE — AB
Protein, ur: 30 mg/dL — AB
Specific Gravity, Urine: 1.03 (ref 1.005–1.030)
WBC, UA: >50 WBC/hpf — ABNORMAL HIGH (ref 0–5)
pH: 5 (ref 5.0–8.0)

## 2020-02-08 LAB — COMPREHENSIVE METABOLIC PANEL
ALT: 19 U/L (ref 0–44)
AST: 18 U/L (ref 15–41)
Albumin: 4.4 g/dL (ref 3.5–5.0)
Alkaline Phosphatase: 65 U/L (ref 38–126)
Anion gap: 10 (ref 5–15)
BUN: 14 mg/dL (ref 6–20)
CO2: 25 mmol/L (ref 22–32)
Calcium: 9.3 mg/dL (ref 8.9–10.3)
Chloride: 104 mmol/L (ref 98–111)
Creatinine, Ser: 0.75 mg/dL (ref 0.44–1.00)
GFR calc Af Amer: >60 mL/min (ref >60)
GFR calc non Af Amer: >60 mL/min (ref >60)
Glucose, Bld: 147 mg/dL — ABNORMAL HIGH (ref 70–99)
Potassium: 3.4 mmol/L — ABNORMAL LOW (ref 3.5–5.1)
Sodium: 139 mmol/L (ref 135–145)
Total Bilirubin: 0.5 mg/dL (ref 0.3–1.2)
Total Protein: 8.1 g/dL (ref 6.5–8.1)

## 2020-02-08 LAB — TROPONIN I (HIGH SENSITIVITY): Troponin I (High Sensitivity): <2 ng/L (ref <18)

## 2020-02-08 LAB — TSH: TSH: 0.733 u[IU]/mL (ref 0.350–4.500)

## 2020-02-08 LAB — POCT PREGNANCY, URINE: Preg Test, Ur: NEGATIVE

## 2020-02-08 LAB — FIBRIN DERIVATIVES D-DIMER (ARMC ONLY): Fibrin derivatives D-dimer (ARMC): 172.71 ng/mL (FEU) (ref 0.00–499.00)

## 2020-02-08 NOTE — ED Triage Notes (Signed)
Pt to triage via w/c with no distress noted; st onset dizziness and sensation of heart racing this evening; denies any recent illness, denies hx of same, denies any other accomp symptoms

## 2020-02-09 ENCOUNTER — Emergency Department
Admission: EM | Admit: 2020-02-09 | Discharge: 2020-02-09 | Disposition: A | Discharge status: Home / Self Care | Payer: Self-pay | Attending: Emergency Medicine | Admitting: Emergency Medicine

## 2020-02-09 DIAGNOSIS — N3000 Acute cystitis without hematuria: Secondary | ICD-10-CM

## 2020-02-09 DIAGNOSIS — R000 Tachycardia, unspecified: Secondary | ICD-10-CM

## 2020-02-09 MED ORDER — CEPHALEXIN 500 MG PO CAPS: 500.0000 mg | ORAL_CAPSULE | Freq: Two times a day (BID) | ORAL | 0 refills | Status: AC

## 2020-02-09 NOTE — ED Provider Notes (Signed)
[facility] Emergency Department Provider Note  ____________________________________________   First MD Initiated Contact with Patient 02/09/20 0330     (approximate)  I have reviewed the triage vital signs and the nursing notes.   HISTORY  Chief Complaint Dizziness    HPI Sarah Mooney is a 34 y.o. female with below list of previous medical conditions presents to the emergency department secondary to rapid heartbeat that began at approximately 8 PM tonight.  Patient states that she noted her heart rate to be 134 at home.  Patient states that she felt dizzy and felt as though her heart was racing.  Patient denies any chest pain or shortness of breath with the incident.  Patient denies any recent illness.  Patient denies any nausea or vomiting.  Patient denies any personal familial history of thyroid disease.  Patient does admit to marijuana use this evening at 6 PM.  Patient denies any energy drink consumption.        History reviewed. No pertinent past medical history.  Patient Active Problem List   Diagnosis Date Noted  . Anxiety disorder 10/27/2015  . Obesity, unspecified 10/27/2015  . History of abnormal cervical Pap smear 10/27/2015    History reviewed. No pertinent surgical history.  Prior to Admission medications   Medication Sig Start Date End Date Taking? Authorizing Provider  cyclobenzaprine (FLEXERIL) 5 MG tablet Take 1 tablet (5 mg total) by mouth 3 (three) times daily as needed. 08/21/19   Menshew, Dannielle Karvonen, PA-C  ibuprofen (ADVIL) 800 MG tablet Take 1 tablet (800 mg total) by mouth every 8 (eight) hours as needed. 08/21/19   Menshew, Dannielle Karvonen, PA-C    Allergies Latex  Family History  Problem Relation Age of Onset  . Diabetes Mother   . Hypertension Mother   . Heart disease Mother   . Heart attack Father   . Diabetes Father   . Hypertension Father   . Cancer Maternal Grandmother   . Cancer Maternal Grandfather     . Heart attack Paternal Grandmother     Social History Social History   Tobacco Use  . Smoking status: Never Smoker  . Smokeless tobacco: Never Used  Substance Use Topics  . Alcohol use: Not Currently  . Drug use: Not Currently    Review of Systems Constitutional: No fever/chills Eyes: No visual changes. ENT: No sore throat. Cardiovascular: Denies chest pain. Respiratory: Denies shortness of breath. Gastrointestinal: No abdominal pain.  No nausea, no vomiting.  No diarrhea.  No constipation. Genitourinary: Negative for dysuria. Musculoskeletal: Negative for neck pain.  Negative for back pain. Integumentary: Negative for rash. Neurological: Negative for headaches, focal weakness or numbness.  ____________________________________________   PHYSICAL EXAM:  VITAL SIGNS: ED Triage Vitals  Enc Vitals Group     BP 02/08/20 2246 126/72     Pulse Rate 02/08/20 2246 (!) 126     Resp 02/08/20 2246 20     Temp 02/08/20 2246 98.3 F (36.8 C)     Temp Source 02/08/20 2246 Oral     SpO2 02/08/20 2246 99 %     Weight 02/08/20 2245 90.7 kg (200 lb)     Height 02/08/20 2245 1.549 m (5\' 1" )     Head Circumference --      Peak Flow --      Pain Score 02/08/20 2245 0     Pain Loc --      Pain Edu? --      Excl.  in [place]? --     Constitutional: Alert and oriented.  Eyes: Conjunctivae are normal.  Mouth/Throat: Patient is wearing a mask. Neck: No stridor.  No meningeal signs.   Cardiovascular: Normal rate, regular rhythm. Good peripheral circulation. Grossly normal heart sounds. Respiratory: Normal respiratory effort.  No retractions. Gastrointestinal: Soft and nontender. No distention.  Musculoskeletal: No lower extremity tenderness nor edema. No gross deformities of extremities. Neurologic:  Normal speech and language. No gross focal neurologic deficits are appreciated.  Skin:  Skin is warm, dry and intact. Psychiatric: Mood and affect are normal. Speech and behavior are  normal.  ____________________________________________   LABS (all labs ordered are listed, but only abnormal results are displayed)  Labs Reviewed  CBC WITH DIFFERENTIAL/PLATELET - Abnormal; Notable for the following components:      Result Value   WBC 11.4 (*)    Lymphs Abs 4.3 (*)    All other components within normal limits  COMPREHENSIVE METABOLIC PANEL - Abnormal; Notable for the following components:   Potassium 3.4 (*)    Glucose, Bld 147 (*)    All other components within normal limits  URINALYSIS, COMPLETE (UACMP) WITH MICROSCOPIC - Abnormal; Notable for the following components:   Color, Urine YELLOW (*)    APPearance HAZY (*)    Protein, ur 30 (*)    Nitrite POSITIVE (*)    Leukocytes,Ua MODERATE (*)    WBC, UA >50 (*)    Bacteria, UA MANY (*)    All other components within normal limits  TSH  FIBRIN DERIVATIVES D-DIMER ([facility] ONLY)  T4  POCT PREGNANCY, URINE  TROPONIN I (HIGH SENSITIVITY)   ____________________________________________  EKG  ED ECG REPORT I, Rathbun N Shandelle Borrelli, the attending physician, personally viewed and interpreted this ECG.   Date: 02/08/2020  EKG Time: 10:48 PM  Rate: 126  Rhythm: Sinus tachycardia  Axis: Normal  Intervals: Normal  ST&T Change: None  _______________ Procedures   ____________________________________________   INITIAL IMPRESSION / MDM / ASSESSMENT AND PLAN / ED COURSE  As part of my medical decision making, I reviewed the following data within the electronic MEDICAL RECORD NUMBER  33 year old female presented with above-stated history and physical exam secondary to rapid heartbeat patient noted to be in sinus tachycardia on arrival to the emergency department.  Without any medical intervention patient's current heart rate 92.  Laboratory data consistent with a urinary tract infection.  D-dimer normal glucose elevated at 147 patient advised to follow-up with primary care provider.  In addition urinalysis consistent with  a UTI for which patient will be prescribed Keflex.  Patient will be referred to cardiology for further outpatient evaluation.  ____________________________________________  FINAL CLINICAL IMPRESSION(S) / ED DIAGNOSES  Final diagnoses:  Acute cystitis without hematuria  Sinus tachycardia     MEDICATIONS GIVEN DURING THIS VISIT:  Medications - No data to display   ED Discharge Orders    None      *Please note:  Sarah Mooney was evaluated in Emergency Department on 02/09/2020 for the symptoms described in the history of present illness. She was evaluated in the context of the global COVID-19 pandemic, which necessitated consideration that the patient might be at risk for infection with the SARS-CoV-2 virus that causes COVID-19. Institutional protocols and algorithms that pertain to the evaluation of patients at risk for COVID-19 are in a state of rapid change based on information released by regulatory bodies including the CDC and federal and state organizations. These policies and algorithms were followed  during the patient's care in the ED.  Some ED evaluations and interventions may be delayed as a result of limited staffing during the pandemic.*  Note:  This document was prepared using Dragon voice recognition software and may include unintentional dictation errors.   Darci Current, MD 02/09/20 [phone]

## 2020-02-10 LAB — T4: T4, Total: 7.4 ug/dL (ref 4.5–12.0)

## 2020-03-29 ENCOUNTER — Ambulatory Visit (LOCAL_COMMUNITY_HEALTH_CENTER): Payer: Self-pay | Admitting: Advanced Practice Midwife

## 2020-03-29 ENCOUNTER — Encounter: Payer: Self-pay | Admitting: Advanced Practice Midwife

## 2020-03-29 ENCOUNTER — Other Ambulatory Visit: Payer: Self-pay

## 2020-03-29 VITALS — BP 113/72 | Ht 62.0 in | Wt 205.6 lb

## 2020-03-29 DIAGNOSIS — Z3009 Encounter for other general counseling and advice on contraception: Secondary | ICD-10-CM

## 2020-03-29 DIAGNOSIS — Z8742 Personal history of other diseases of the female genital tract: Secondary | ICD-10-CM

## 2020-03-29 LAB — WET PREP FOR TRICH, YEAST, CLUE
Trichomonas Exam: NEGATIVE
Yeast Exam: NEGATIVE

## 2020-03-29 NOTE — Progress Notes (Signed)
Patient here today for PE and STD screening. Last PE 2018, last CBE 2016. Last Pap 10/22/2017. Pap negative, HPV negative.Burt Knack, RN

## 2020-03-29 NOTE — Progress Notes (Signed)
Wet mount reviewed, no treatment indicated..Leelan Rajewski Brewer-Jensen, RN 

## 2020-03-29 NOTE — Progress Notes (Signed)
Family Planning Visit- Initial Visit  Subjective:  Sarah Mooney is a 34 y.o. SBF nullip exsmoker (last Black & Mild 02/2020). No obstetric history on file.   being seen today for an initial well woman visit and to discuss family planning options.  She is currently using none for pregnancy prevention. Patient reports she does not want a pregnancy in the next year.  Patient has the following medical conditions has Anxiety disorder; Obesity, BMI=37.6; and History of abnormal cervical Pap smear 05/01/12 HSIL mod/severe dysplasia on their problem list.  Chief Complaint  Patient presents with  . Annual Exam  . Exposure to STD    Patient reports LMP 01/29/20.  Last sex 03/22/20 without condom.  Last MJ 02/2020.  Last ETOH 03/22/20 (1 shot [place]).  1 sex partner in last 3 mo.  Last PE 2018.  Last CBE 2016.  Last pap 10/22/2017 neg HPV neg.  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  Colpo 09/08/12 ASCUS with 4 bx with HGSIL.  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  10/27/15 neg.  10/22/17 neg HPV neg.  Patient denies need for birth control.   Body mass index is 37.6 kg/m. - Patient is eligible for diabetes screening based on BMI and age >24?  not applicable HA1C ordered? not applicable  Patient reports 1 of partners in last year. Desires STI screening?  Yes  Has patient been screened once for HCV in the past?  Yes  No results found for: HCVAB  Does the patient have current of drug use, have a partner with drug use, and/or has been incarcerated since last result? Yes  If yes-- Screen for HCV through [facility]   Does the patient meet criteria for HBV testing? Yes  Criteria:  -Household, sexual or needle sharing contact with HBV -History of drug use -HIV positive -Those with known Hep C   Health Maintenance Due  Topic Date Due  . COVID-19 Vaccine (1) Never done  . TETANUS/TDAP  Never done    Review of Systems  All other systems reviewed and are negative.   The following portions of  the patient's history were reviewed and updated as appropriate: allergies, current medications, past family history, past medical history, past social history, past surgical history and problem list. Problem list updated.   See flowsheet for other program required questions.  Objective:   Vitals:   03/29/20 1441  BP: 113/72  Weight: 205 lb 9.6 oz (93.3 kg)  Height: 5\' 2"  (1.575 m)    Physical Exam Constitutional:      Appearance: Normal appearance. She is obese.  HENT:     Head: Normocephalic and atraumatic.     Mouth/Throat:     Mouth: Mucous membranes are moist.  Eyes:     Conjunctiva/sclera: Conjunctivae normal.  Cardiovascular:     Rate and Rhythm: Normal rate and regular rhythm.  Pulmonary:     Effort: Pulmonary effort is normal.     Breath sounds: Normal breath sounds.  Chest:     Breasts:        Right: Normal.        Left: Normal.  Abdominal:     Palpations: Abdomen is soft.     Comments: Soft, poor tone, without tenderness, increased adipose  Genitourinary:    General: Normal vulva.     Exam position: Lithotomy position.     Vagina: Vaginal discharge (white creamy leukorrhea, ph<4.5) present.     Cervix: Normal.  Uterus: Normal.      Adnexa: Right adnexa normal and left adnexa normal.     Rectum: Normal.  Musculoskeletal:        General: Normal range of motion.     Cervical back: Normal range of motion and neck supple.  Skin:    General: Skin is warm and dry.  Neurological:     Mental Status: She is alert.  Psychiatric:        Mood and Affect: Mood normal.       Assessment and Plan:  Sarah Mooney is a 34 y.o. female presenting to the [facility] Department for an initial well woman exam/family planning visit  Contraception counseling: Reviewed all forms of birth control options in the tiered based approach. available including abstinence; over the counter/barrier methods; hormonal contraceptive medication including pill, patch,  ring, injection,contraceptive implant, ECP; hormonal and nonhormonal IUDs; permanent sterilization options including vasectomy and the various tubal sterilization modalities. Risks, benefits, and typical effectiveness rates were reviewed.  Questions were answered.  Written information was also given to the patient to review.  Patient desires nothing for birth control, this was prescribed for patient. She will follow up in  1 year for surveillance.  She was told to call with any further questions, or with any concerns about this method of contraception.  Emphasized use of condoms 100% of the time for STI prevention.  Patient was offered ECP. ECP was not accepted by the patient. ECP counseling was not given - see RN documentation  1. History of abnormal cervical Pap smear [place] done today  2. Family planning Treat wet mount per standing orders Immunization nurse consult Please give PCP list to pt Declines Hep B/C testing - WET PREP FOR TRICH, YEAST, CLUE - IGP, Aptima HPV - HIV [place] [facility]     Return if symptoms worsen or fail to improve.  No future appointments.  Herbie Saxon, CNM

## 2020-04-04 LAB — IGP, APTIMA HPV
HPV Aptima: NEGATIVE
PAP Smear Comment: 0

## 2020-05-04 ENCOUNTER — Emergency Department
Admission: EM | Admit: 2020-05-04 | Discharge: 2020-05-04 | Disposition: A | Discharge status: Home / Self Care | Payer: Self-pay | Attending: Student | Admitting: Student

## 2020-05-04 ENCOUNTER — Other Ambulatory Visit: Payer: Self-pay

## 2020-05-04 ENCOUNTER — Emergency Department: Payer: Self-pay

## 2020-05-04 ENCOUNTER — Encounter: Payer: Self-pay | Admitting: Emergency Medicine

## 2020-05-04 DIAGNOSIS — R350 Frequency of micturition: Secondary | ICD-10-CM | POA: Insufficient documentation

## 2020-05-04 DIAGNOSIS — R0789 Other chest pain: Secondary | ICD-10-CM | POA: Insufficient documentation

## 2020-05-04 LAB — COMPREHENSIVE METABOLIC PANEL
ALT: 14 U/L (ref 0–44)
AST: 14 U/L — ABNORMAL LOW (ref 15–41)
Albumin: 3.9 g/dL (ref 3.5–5.0)
Alkaline Phosphatase: 61 U/L (ref 38–126)
Anion gap: 7 (ref 5–15)
BUN: 12 mg/dL (ref 6–20)
CO2: 28 mmol/L (ref 22–32)
Calcium: 8.9 mg/dL (ref 8.9–10.3)
Chloride: 106 mmol/L (ref 98–111)
Creatinine, Ser: 0.79 mg/dL (ref 0.44–1.00)
GFR calc Af Amer: >60 mL/min (ref >60)
GFR calc non Af Amer: >60 mL/min (ref >60)
Glucose, Bld: 113 mg/dL — ABNORMAL HIGH (ref 70–99)
Potassium: 3.9 mmol/L (ref 3.5–5.1)
Sodium: 141 mmol/L (ref 135–145)
Total Bilirubin: 0.6 mg/dL (ref 0.3–1.2)
Total Protein: 7.5 g/dL (ref 6.5–8.1)

## 2020-05-04 LAB — CBC WITH DIFFERENTIAL/PLATELET
Abs Immature Granulocytes: 0.02 10*3/uL (ref 0.00–0.07)
Basophils Absolute: 0.1 10*3/uL (ref 0.0–0.1)
Basophils Relative: 1 %
Eosinophils Absolute: 0.4 10*3/uL (ref 0.0–0.5)
Eosinophils Relative: 4 %
HCT: 37.8 % (ref 36.0–46.0)
Hemoglobin: 12.6 g/dL (ref 12.0–15.0)
Immature Granulocytes: 0 %
Lymphocytes Relative: 43 %
Lymphs Abs: 4.1 10*3/uL — ABNORMAL HIGH (ref 0.7–4.0)
MCH: 28.6 pg (ref 26.0–34.0)
MCHC: 33.3 g/dL (ref 30.0–36.0)
MCV: 85.7 fL (ref 80.0–100.0)
Monocytes Absolute: 0.7 10*3/uL (ref 0.1–1.0)
Monocytes Relative: 7 %
Neutro Abs: 4.4 10*3/uL (ref 1.7–7.7)
Neutrophils Relative %: 45 %
Platelets: 300 10*3/uL (ref 150–400)
RBC: 4.41 MIL/uL (ref 3.87–5.11)
RDW: 13.3 % (ref 11.5–15.5)
WBC: 9.5 10*3/uL (ref 4.0–10.5)
nRBC: 0 % (ref 0.0–0.2)

## 2020-05-04 LAB — URINALYSIS, COMPLETE (UACMP) WITH MICROSCOPIC
Bilirubin Urine: NEGATIVE
Glucose, UA: NEGATIVE mg/dL
Hgb urine dipstick: NEGATIVE
Ketones, ur: NEGATIVE mg/dL
Nitrite: NEGATIVE
Protein, ur: NEGATIVE mg/dL
Specific Gravity, Urine: 1.015 (ref 1.005–1.030)
pH: 5 (ref 5.0–8.0)

## 2020-05-04 LAB — TROPONIN I (HIGH SENSITIVITY)
Troponin I (High Sensitivity): 2 ng/L (ref <18)
Troponin I (High Sensitivity): <2 ng/L (ref <18)

## 2020-05-04 LAB — PREGNANCY, URINE: Preg Test, Ur: NEGATIVE

## 2020-05-04 MED ORDER — KETOROLAC TROMETHAMINE 30 MG/ML IJ SOLN
30.0000 mg | Freq: Once | INTRAMUSCULAR | Status: AC
  Administered 2020-05-04: 30 mg via INTRAMUSCULAR
  Filled 2020-05-04: qty 1

## 2020-05-04 MED ORDER — CEPHALEXIN 500 MG PO CAPS: 500.0000 mg | ORAL_CAPSULE | Freq: Two times a day (BID) | ORAL | 0 refills | Status: AC

## 2020-05-04 MED ORDER — NAPROXEN 500 MG PO TABS: 500.0000 mg | ORAL_TABLET | Freq: Two times a day (BID) | ORAL | 0 refills | Status: AC

## 2020-05-04 NOTE — Discharge Instructions (Addendum)
Thank you for letting us take care of you in the emergency department today.   New medications we have prescribed:  Naproxen, take as needed for pain Keflex, for urine infection  Please follow up with: Your primary care doctor to review your ER visit and follow up on your symptoms.   Please return to the ER for any new or worsening symptoms.

## 2020-05-04 NOTE — ED Triage Notes (Signed)
Patient ambulatory to triage with steady gait, without difficulty or distress noted, mask in place; pt reports left sided CP and "whole upper body hurting" x hour; denies any accomp symptoms

## 2020-05-04 NOTE — ED Provider Notes (Signed)
[facility] Emergency Department Provider Note  ____________________________________________   First MD Initiated Contact with Patient 05/04/20 [phone]     (approximate)  I have reviewed the triage vital signs and the nursing notes.  History  Chief Complaint Chest Pain    HPI Sarah Mooney is a 34 y.o. female past medical history as below, who presents to the emergency department for bilateral, lower lateral rib pain.  Patient states she woke up this morning to use the restroom when she first noticed chest pain.  She locates it along her lower lateral ribs, bilaterally.  Describes it as a soreness, currently mild in severity.  No radiation.   No associated diaphoresis, shortness of breath, nausea, fevers, cough, vomiting, diarrhea.  Denies any trauma.  No recent illnesses or infections.  Denies any changes to her activity level or exercise regimen.  Has not taken anything for her symptoms.  Also requesting UTI testing, states she has had increased urinary frequency, but no dysuria or malodorous urine.  Not on any hormonal medications, no leg swelling, no hemoptysis, no recent travel, no history of VTE.   Past Medical Hx History reviewed. No pertinent past medical history.  Problem List Patient Active Problem List   Diagnosis Date Noted  . Anxiety disorder 10/27/2015  . Obesity, BMI=37.6 10/27/2015  . 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 No pertinent surgical history.  Medications Prior to Admission medications   Medication Sig Start Date End Date Taking? Authorizing Provider  cyclobenzaprine (FLEXERIL) 5 MG tablet Take 1 tablet (5 mg total) by mouth 3 (three) times daily as needed. Patient not taking: Reported on 03/29/2020 08/21/19   Menshew, Dannielle Karvonen, PA-C  ibuprofen (ADVIL) 800 MG tablet Take 1 tablet (800 mg total) by mouth every 8 (eight) hours as  needed. Patient not taking: Reported on 03/29/2020 08/21/19   Menshew, Dannielle Karvonen, PA-C    Allergies Latex  Family Hx Family History  Problem Relation Age of Onset  . Diabetes Mother   . Hypertension Mother   . Heart disease Mother   . Heart attack Father   . Diabetes Father   . Hypertension Father   . Cancer Maternal Grandmother   . Cancer Maternal Grandfather   . Heart attack Paternal Grandmother     Social Hx Social History   Tobacco Use  . Smoking status: Never Smoker  . Smokeless tobacco: Never Used  Substance Use Topics  . Alcohol use: Not Currently  . Drug use: Not Currently     Review of Systems  Constitutional: Negative for fever. Negative for chills. Eyes: Negative for visual changes. ENT: Negative for sore throat. Cardiovascular: + for chest pain. Respiratory: Negative for shortness of breath. Gastrointestinal: Negative for nausea. Negative for vomiting.  Genitourinary: Negative for dysuria. + urinary frequency Musculoskeletal: Negative for leg swelling. Skin: Negative for rash. Neurological: Negative for headaches.   Physical Exam  Vital Signs: ED Triage Vitals  Enc Vitals Group     BP 05/04/20 0323 113/77     Pulse Rate 05/04/20 0323 79     Resp 05/04/20 0323 18     Temp 05/04/20 0323 98 F (36.7 C)     Temp Source 05/04/20 0323 Oral     SpO2 05/04/20 0323 100 %     Weight 05/04/20 0322 200 lb (90.7 kg)     Height 05/04/20 0322 5\' 2"  (1.575 m)  Head Circumference --      Peak Flow --      Pain Score 05/04/20 0322 10     Pain Loc --      Pain Edu? --      Excl. in GC? --     Constitutional: Alert and oriented. Well appearing. NAD.  Head: Normocephalic. Atraumatic. Eyes: Conjunctivae clear. Sclera anicteric. Pupils equal and symmetric. Nose: No masses or lesions. No congestion or rhinorrhea. Mouth/Throat: Wearing mask.  Neck: No stridor. Trachea midline.  Cardiovascular: Normal rate, regular rhythm. Extremities well  perfused. Chest: Chest wall is stable.  Pain is reproducible with palpation along the lower lateral ribs, bilaterally.  No palpable step-offs, deformities, or crepitance. Respiratory: Normal respiratory effort.  Lungs CTAB. Gastrointestinal: Soft. Non-distended. Non-tender.  Genitourinary: Deferred. Musculoskeletal: No lower extremity edema. No deformities. Neurologic:  Normal speech and language. No gross focal or lateralizing neurologic deficits are appreciated.  Skin: Skin is warm, dry and intact. No rash noted. Psychiatric: Mood and affect are appropriate for situation.  EKG  Personally reviewed and interpreted by myself.   Date: 05/04/20 Time: 0324 Rate: 74 Rhythm: sinus Axis: borderline left Intervals: WNL TWI III No acute ischemic changes No acute arrhythmias No STEMI    Radiology  Personally reviewed available imaging myself.   CXR - IMPRESSION:  No active cardiopulmonary disease.    Procedures  Procedure(s) performed (including critical care):  Procedures   Initial Impression / Assessment and Plan / MDM / ED Course  34 y.o. female who presents to the ED for atypical chest pain, as described above.  More so soreness to the bilateral lateral lower rib area.  Ddx: MSK, costochondritis, pleurisy, atypical ACS.  PERC negative.  Will plan for labs, imaging, symptom control and reassess.  EKG demonstrates no acute ischemic changes and no acute arrhythmias.  Troponin x 2 negative.  CXR negative.  UA potentially indicative of infection with LE, WBC, bacteria.  In the setting of her symptoms, will opt to treat.  Otherwise, feel patient is stable for discharge. Rx for naproxen as needed for symptom control, Keflex for UTI.  Advised PCP follow-up and given return precautions.  Patient voices understanding and is in agreement.   _______________________________   As part of my medical decision making I have reviewed available labs, radiology tests, reviewed old  records/performed chart review.    Final Clinical Impression(s) / ED Diagnosis  Atypical chest pain Urinary frequency    Note:  This document was prepared using Dragon voice recognition software and may include unintentional dictation errors.   Miguel Aschoff., MD 05/04/20 [phone]

## 2020-12-13 IMAGING — CR DG CHEST 2V
1 series · 2 of 2 positions shown · non-contrast
Comparison: None.

CLINICAL DATA: Chest pain

EXAM:
CHEST - 2 VIEW

[Series 1: dg chest 2 view · 0.14mm/px · 2 of 2 slices shown]
[im 1/2]
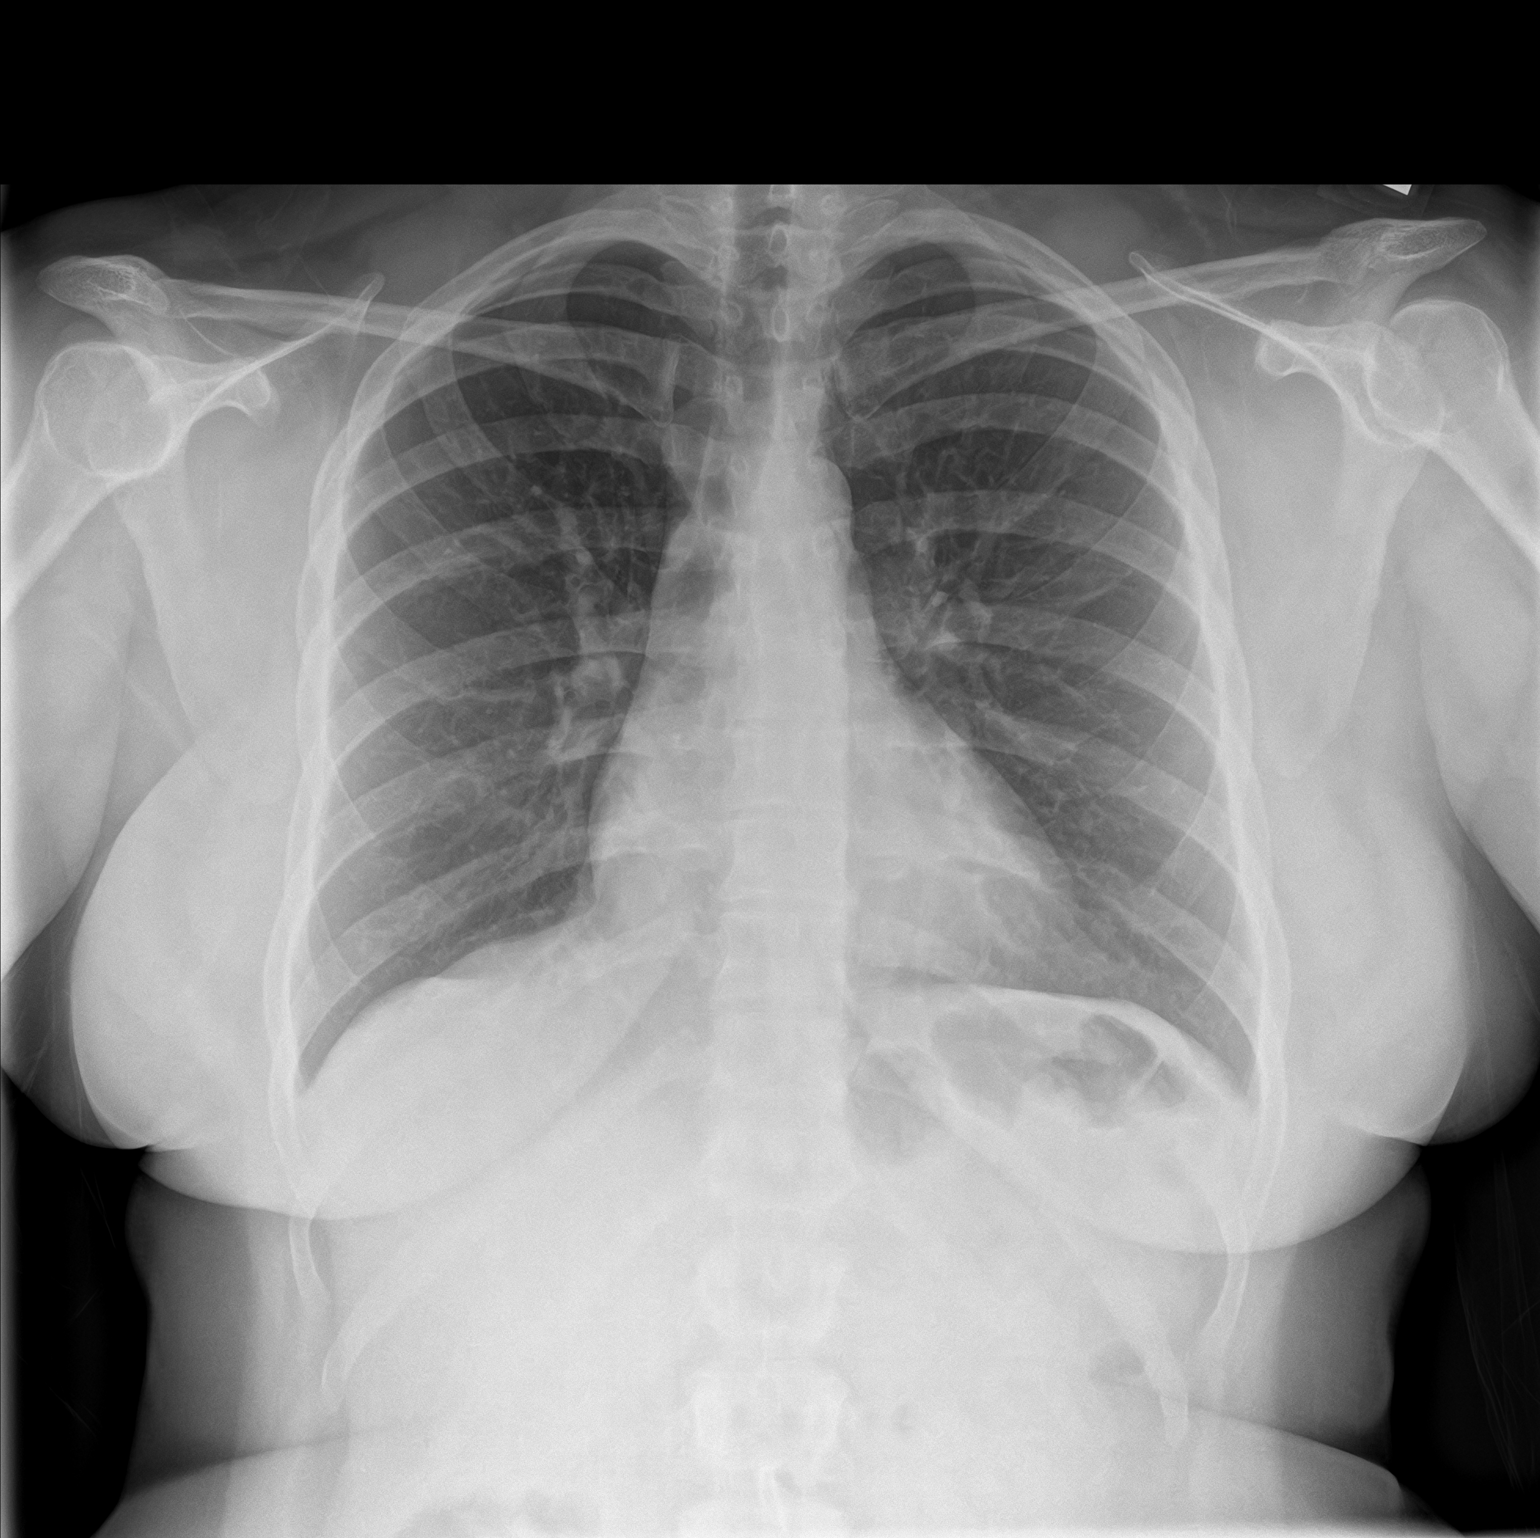
[im 2/2]
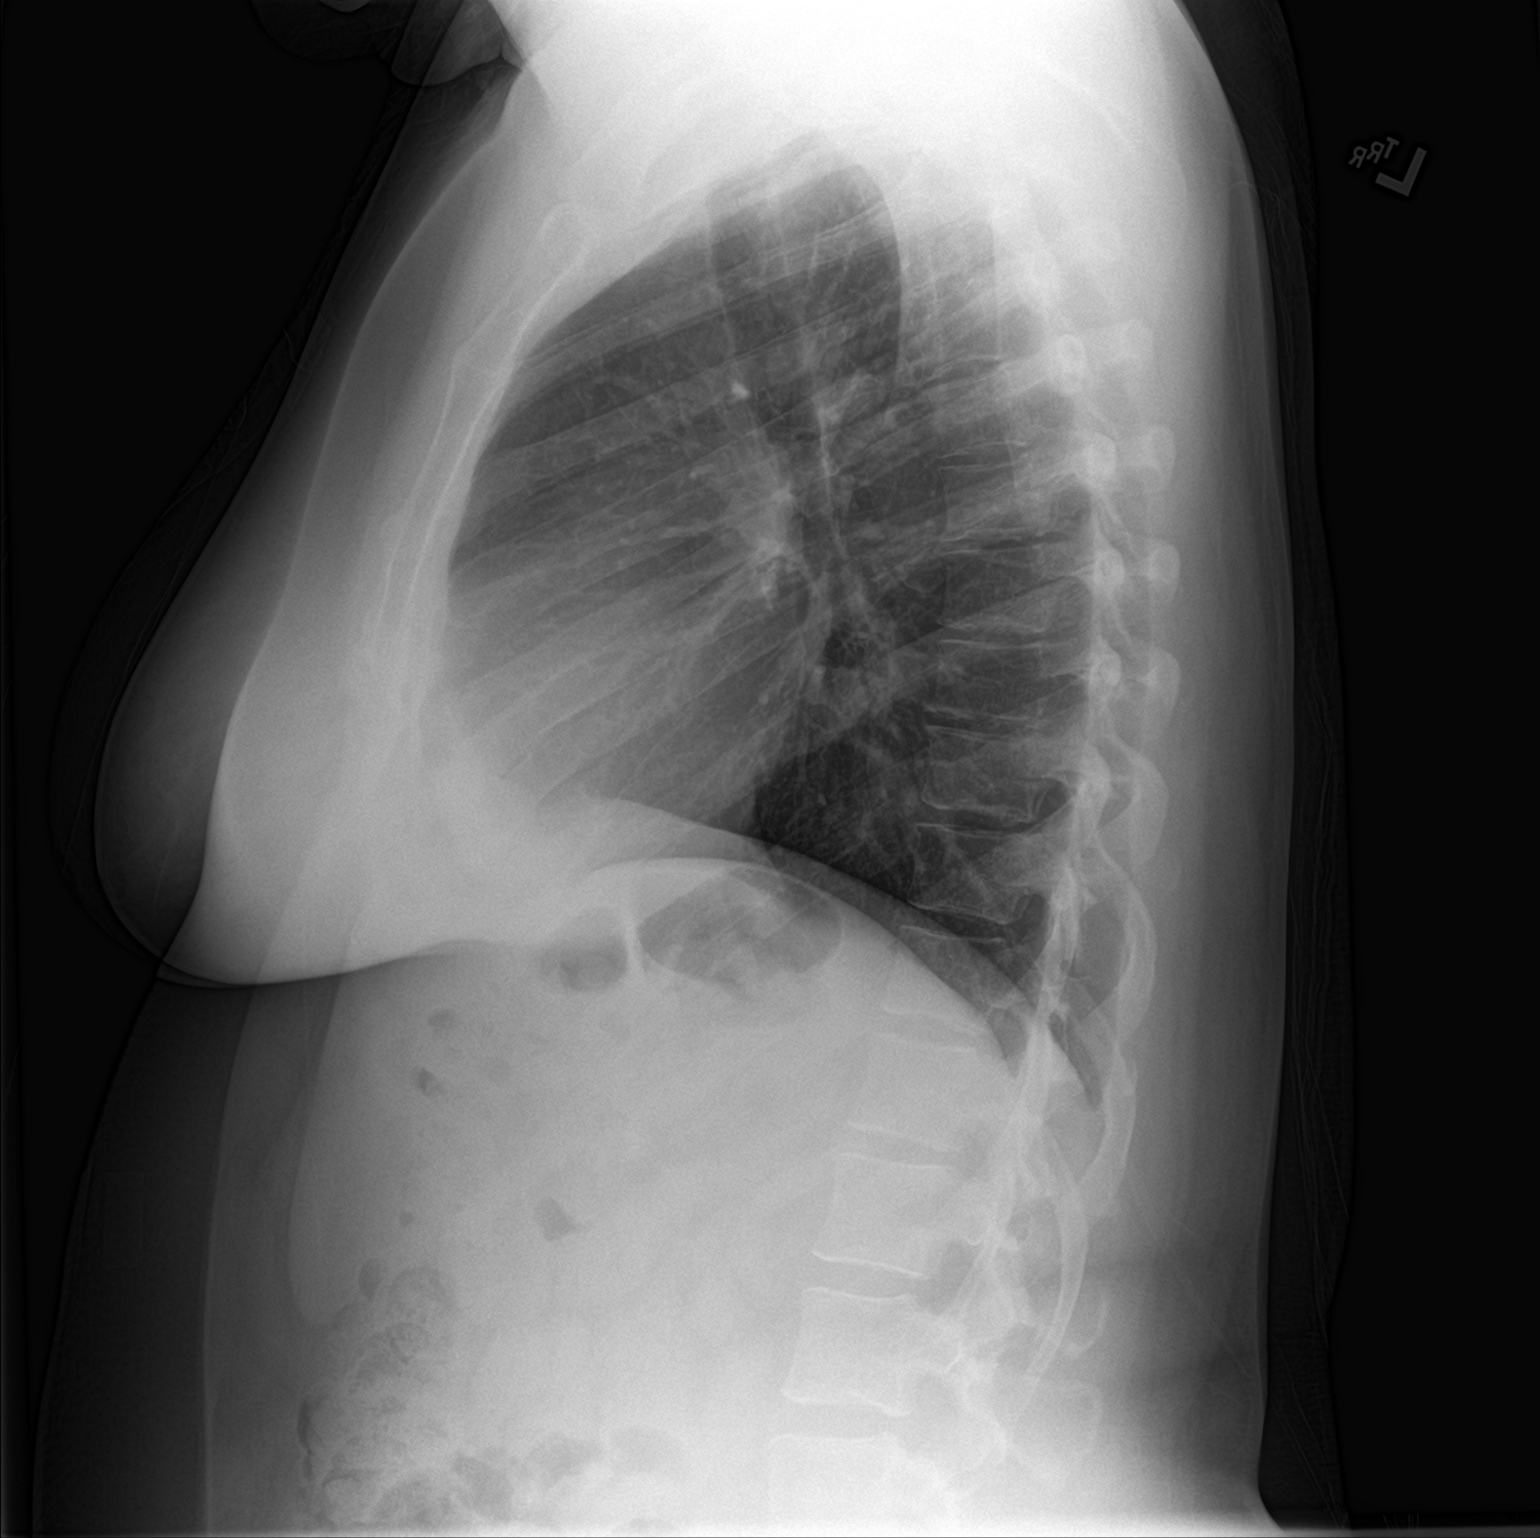

[2 of 2 positions shown; findings below may reference images not displayed]

FINDINGS: The heart size and mediastinal contours are within normal limits.
Both lungs are clear. The visualized skeletal structures are
unremarkable.
IMPRESSION: No active cardiopulmonary disease.

## 2021-03-13 ENCOUNTER — Encounter: Payer: Self-pay | Admitting: Emergency Medicine

## 2021-03-13 ENCOUNTER — Emergency Department
Admission: EM | Admit: 2021-03-13 | Discharge: 2021-03-13 | Disposition: A | Discharge status: Home / Self Care | Payer: Self-pay | Attending: Emergency Medicine | Admitting: Emergency Medicine

## 2021-03-13 ENCOUNTER — Other Ambulatory Visit: Payer: Self-pay

## 2021-03-13 DIAGNOSIS — Y990 Civilian activity done for income or pay: Secondary | ICD-10-CM | POA: Insufficient documentation

## 2021-03-13 DIAGNOSIS — Z91040 Latex allergy status: Secondary | ICD-10-CM | POA: Insufficient documentation

## 2021-03-13 DIAGNOSIS — X501XXA Overexertion from prolonged static or awkward postures, initial encounter: Secondary | ICD-10-CM | POA: Insufficient documentation

## 2021-03-13 DIAGNOSIS — S76112A Strain of left quadriceps muscle, fascia and tendon, initial encounter: Secondary | ICD-10-CM | POA: Insufficient documentation

## 2021-03-13 DIAGNOSIS — Y9301 Activity, walking, marching and hiking: Secondary | ICD-10-CM | POA: Insufficient documentation

## 2021-03-13 MED ORDER — CYCLOBENZAPRINE HCL 7.5 MG PO TABS: 7.5000 mg | ORAL_TABLET | Freq: Three times a day (TID) | ORAL | 0 refills | Status: DC | PRN

## 2021-03-13 NOTE — Discharge Instructions (Signed)
No driving or working in dangerous areas within 8 hours of taking cyclobenzaprine, it can make you drowsy.  Return to the ER right away if worsening pain, loss of sensation, fever, rash, other new or concerning symptoms.

## 2021-03-13 NOTE — ED Provider Notes (Signed)
[facility] Emergency Department Provider Note   ____________________________________________   Event Date/Time   First MD Initiated Contact with Patient 03/13/21 [phone]     (approximate)  I have reviewed the triage vital signs and the nursing notes.   HISTORY  Chief Complaint Back Pain    HPI Sarah Mooney is a 35 y.o. female reports that at work on Friday she was walking, she suddenly developed a pain over the front of her left thigh.  The pain is persisted since then.  It is worsened by walking.  Relieved somewhat by taking Tylenol and ibuprofen.  There is no numbness no cold or blue foot.  No rash no fevers.  No abdominal pain.  Denies pregnancy.  Last menstrual cycle was in March, reports irregular periods, and has not been sexually active for many months.  She is able to walk but reports pain comes about when she stands to bear weight on her left leg.  Does radiate some pain towards her left back at times, but not really "back pain"  There was no known injury     History reviewed. No pertinent past medical history.  Patient Active Problem List   Diagnosis Date Noted  . Anxiety disorder 10/27/2015  . Obesity, BMI=37.6 10/27/2015  . 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 No pertinent surgical history.  Prior to Admission medications   Medication Sig Start Date End Date Taking? Authorizing Provider  cyclobenzaprine (FEXMID) 7.5 MG tablet Take 1 tablet (7.5 mg total) by mouth 3 (three) times daily as needed for muscle spasms. 03/13/21  Yes Sharyn Creamer, MD  ibuprofen (ADVIL) 800 MG tablet Take 1 tablet (800 mg total) by mouth every 8 (eight) hours as needed. Patient not taking: Reported on 03/29/2020 08/21/19   Menshew, Charlesetta Ivory, PA-C    Allergies Latex  Family History  Problem Relation Age of Onset  . Diabetes Mother   . Hypertension Mother   . Heart disease Mother    . Heart attack Father   . Diabetes Father   . Hypertension Father   . Cancer Maternal Grandmother   . Cancer Maternal Grandfather   . Heart attack Paternal Grandmother     Social History Social History   Tobacco Use  . Smoking status: Never Smoker  . Smokeless tobacco: Never Used  Vaping Use  . Vaping Use: Never used  Substance Use Topics  . Alcohol use: Not Currently  . Drug use: Not Currently    Review of Systems Constitutional: No fever/chills Cardiovascular: Denies chest pain. Respiratory: Denies shortness of breath. Gastrointestinal: No abdominal pain.   Genitourinary: Negative for dysuria.  No changes in bowel or bladder habit. Musculoskeletal: See HPI Skin: Negative for rash. Neurological: Negative for areas of focal weakness or numbness.  The legs do not feel weak.  Relieved by resting, elevating legs.  Worsened by walking  ____________________________________________   PHYSICAL EXAM:  VITAL SIGNS: ED Triage Vitals  Enc Vitals Group     BP 03/13/21 0700 129/86     Pulse Rate 03/13/21 0700 81     Resp 03/13/21 0700 18     Temp 03/13/21 0700 97.6 F (36.4 C)     Temp Source 03/13/21 0700 Oral     SpO2 03/13/21 0700 97 %     Weight 03/13/21 0656 209 lb (94.8 kg)     Height 03/13/21 0656 5\' 3"  (1.6 m)  Head Circumference --      Peak Flow --      Pain Score 03/13/21 0655 10     Pain Loc --      Pain Edu? --      Excl. in GC? --     Constitutional: Alert and oriented. Well appearing and in no acute distress. Eyes: Conjunctivae are normal. Head: Atraumatic. Nose: No congestion/rhinnorhea. Mouth/Throat: Mucous membranes are moist. Neck: No stridor.  Cardiovascular: Normal rate, regular rhythm. Grossly normal heart sounds.  Good peripheral circulation. Respiratory: Normal respiratory effort.  No retractions. Lungs CTAB. Gastrointestinal: Soft and nontender. No distention. Musculoskeletal:   Lower Extremities  No edema. Normal DP/PT pulses  bilateral with good cap refill.  Normal neuro-motor function lower extremities bilateral.  RIGHT Right lower extremity demonstrates normal strength, good use of all muscles. No edema bruising or contusions of the right hip, right knee, right ankle. Full range of motion of the right lower extremity without pain. No pain on axial loading. No evidence of trauma.  LEFT Left lower extremity demonstrates normal strength, good use of all muscles. No edema bruising or contusions of the hip,  knee, ankle. Full range of motion of the left lower extremity however she does have tenderness to palpation over the distal third of the left quadricep without swelling or defect. No pain on axial loading. No evidence of trauma.  She is able to ambulate, but when she extends the hip it does induce some pain across the anterior left thigh.  No pain to palpation of the lumbar spine or paraspinous regions of the lower back   Neurologic:  Normal speech and language. No gross focal neurologic deficits are appreciated.  Skin:  Skin is warm, dry and intact. No rash noted. Psychiatric: Mood and affect are normal. Speech and behavior are normal.  ____________________________________________   LABS (all labs ordered are listed, but only abnormal results are displayed)  Labs Reviewed - No data to display ____________________________________________  EKG   ____________________________________________  RADIOLOGY   ____________________________________________   PROCEDURES  Procedure(s) performed: None  Procedures  Critical Care performed: No  ____________________________________________   INITIAL IMPRESSION / ASSESSMENT AND PLAN / ED COURSE  Pertinent labs & imaging results that were available during my care of the patient were reviewed by me and considered in my medical decision making (see chart for details).   Very reassuring exam.  Normal circulatory neurovascular examination of the lower  extremities.  She does have isolated pain discomfort that appears to be over the anterior left quadriceps.  Given the history of occurring suddenly while walking I suspect she may have a mild quadriceps tear.  There is no evidence of any acute vascular complication.  No signs or symptoms of DVT.  She has no acute lower back pain, and she has no loss of sensation.  Triage note reports the patient has been complaining of lower back pain, but to my exam and discussion the patient reports the pain is actually across her left thigh and does radiate some towards her back.  Discussed with the patient careful return precautions.  I doubt that acute imaging would be of help at this time.  She is afebrile nontoxic well-appearing.  She is able to ambulate but with some discomfort with bearing weight on the left leg.  Discussed safety around use of cyclobenzaprine including its potential sedating effects.  Return precautions and treatment recommendations and follow-up discussed with the patient who is agreeable with the plan.  ____________________________________________   FINAL CLINICAL IMPRESSION(S) / ED DIAGNOSES  Final diagnoses:  Strain of left quadriceps, initial encounter        Note:  This document was prepared using Dragon voice recognition software and may include unintentional dictation errors       Sharyn Creamer, MD 03/13/21 [phone]

## 2021-03-13 NOTE — ED Triage Notes (Signed)
Patient ambulatory to triage with steady gait, without difficulty or distress noted; pt reports since Friday legs have been aching with lower back pain; denies any known injury

## 2021-07-03 ENCOUNTER — Other Ambulatory Visit: Payer: Self-pay

## 2022-01-28 ENCOUNTER — Ambulatory Visit (LOCAL_COMMUNITY_HEALTH_CENTER): Payer: Self-pay

## 2022-01-28 ENCOUNTER — Other Ambulatory Visit: Payer: Self-pay

## 2022-01-28 DIAGNOSIS — Z111 Encounter for screening for respiratory tuberculosis: Secondary | ICD-10-CM

## 2022-01-31 ENCOUNTER — Other Ambulatory Visit: Payer: Self-pay

## 2022-01-31 ENCOUNTER — Ambulatory Visit (LOCAL_COMMUNITY_HEALTH_CENTER): Payer: Self-pay

## 2022-01-31 DIAGNOSIS — Z111 Encounter for screening for respiratory tuberculosis: Secondary | ICD-10-CM

## 2022-01-31 LAB — TB SKIN TEST
Induration: 0 mm
TB Skin Test: NEGATIVE

## 2022-05-01 ENCOUNTER — Ambulatory Visit: Payer: Medicaid Other

## 2022-06-26 ENCOUNTER — Ambulatory Visit: Payer: Self-pay

## 2022-07-01 ENCOUNTER — Ambulatory Visit: Payer: Self-pay

## 2022-07-04 ENCOUNTER — Ambulatory Visit (LOCAL_COMMUNITY_HEALTH_CENTER): Payer: Medicaid Other | Admitting: Nurse Practitioner

## 2022-07-04 ENCOUNTER — Encounter: Payer: Self-pay | Admitting: Nurse Practitioner

## 2022-07-04 VITALS — BP 120/75 | Ht 62.0 in | Wt 211.6 lb

## 2022-07-04 DIAGNOSIS — Z3009 Encounter for other general counseling and advice on contraception: Secondary | ICD-10-CM

## 2022-07-04 DIAGNOSIS — Z01419 Encounter for gynecological examination (general) (routine) without abnormal findings: Secondary | ICD-10-CM

## 2022-07-04 DIAGNOSIS — Z113 Encounter for screening for infections with a predominantly sexual mode of transmission: Secondary | ICD-10-CM

## 2022-07-04 DIAGNOSIS — B9689 Other specified bacterial agents as the cause of diseases classified elsewhere: Secondary | ICD-10-CM

## 2022-07-04 LAB — WET PREP FOR TRICH, YEAST, CLUE
Trichomonas Exam: NEGATIVE
Yeast Exam: NEGATIVE

## 2022-07-04 MED ORDER — METRONIDAZOLE 500 MG PO TABS: 500.0000 mg | ORAL_TABLET | Freq: Two times a day (BID) | ORAL | 0 refills | Status: DC

## 2022-07-04 NOTE — Progress Notes (Signed)
[facility] DEPARTMENT [facility] [address] Main Number: [phone]    Family Planning Visit- Initial Visit  Subjective:  Sarah Mooney is a 36 y.o.  [id number]   being seen today for an initial annual visit and to discuss reproductive life planning.  The patient is currently using No Method - Other Reason for pregnancy prevention. Patient reports   does not want a pregnancy in the next year.     report they are looking for a method that provides Other not interested in a method.  Patient has the following medical conditions has Anxiety disorder; Obesity, BMI=37.6; and History of abnormal cervical Pap smear 05/01/12 HSIL mod/severe dysplasia on their problem list.  Chief Complaint  Patient presents with   Contraception    Physical patient stated she is having frequent urination and vaginal odor     Patient reports to clinic today for a physical and STD screening.    Body mass index is 38.7 kg/m. - Patient is eligible for diabetes screening based on BMI and age >71?  not applicable HA1C ordered? not applicable  Patient reports 1  partner/s in last year. Desires STI screening?  Yes  Has patient been screened once for HCV in the past?  No  No results found for: "HCVAB"  Does the patient have current drug use (including MJ), have a partner with drug use, and/or has been incarcerated since last result? No  If yes-- Screen for HCV through [facility]   Does the patient meet criteria for HBV testing? Yes  Criteria:  -Household, sexual or needle sharing contact with HBV -History of drug use -HIV positive -Those with known Hep C   Health Maintenance Due  Topic Date Due   COVID-19 Vaccine (1) Never done   Hepatitis C Screening  Never done   INFLUENZA VACCINE  06/18/2022    Review of Systems  Constitutional:  Negative for chills, fever, malaise/fatigue and weight loss.  HENT:  Negative for congestion, hearing loss and sore  throat.   Eyes:  Negative for blurred vision, double vision and photophobia.  Respiratory:  Negative for shortness of breath.   Cardiovascular:  Negative for chest pain.  Gastrointestinal:  Negative for abdominal pain, blood in stool, constipation, diarrhea, heartburn, nausea and vomiting.  Genitourinary:  Negative for dysuria and frequency.       Frequent urination and discomfort with urination   Musculoskeletal:  Negative for back pain, joint pain and neck pain.  Skin:  Negative for itching and rash.  Neurological:  Negative for dizziness, weakness and headaches.  Endo/Heme/Allergies:  Does not bruise/bleed easily.  Psychiatric/Behavioral:  Negative for depression, substance abuse and suicidal ideas.     The following portions of the patient's history were reviewed and updated as appropriate: allergies, current medications, past family history, past medical history, past social history, past surgical history and problem list. Problem list updated.   See flowsheet for other program required questions.  Objective:   Vitals:   07/04/22 1513  BP: 120/75  Weight: 211 lb 9.6 oz (96 kg)  Height: 5\' 2"  (1.575 m)    Physical Exam Constitutional:      Appearance: Normal appearance.  HENT:     Head: Normocephalic. No abrasion, masses or laceration. Hair is normal.     Jaw: No tenderness or swelling.     Right Ear: External ear normal.     Left Ear: External ear normal.     Nose: Nose normal.  Mouth/Throat:     Lips: Pink. No lesions.     Mouth: Mucous membranes are moist. No lacerations or oral lesions.     Dentition: No dental caries.     Tongue: No lesions.     Palate: No mass and lesions.     Pharynx: No pharyngeal swelling, oropharyngeal exudate, posterior oropharyngeal erythema or uvula swelling.     Tonsils: No tonsillar exudate or tonsillar abscesses.  Eyes:     General: No scleral icterus.    Pupils: Pupils are equal, round, and reactive to light.  Neck:      Thyroid: No thyroid mass, thyromegaly or thyroid tenderness.  Cardiovascular:     Rate and Rhythm: Normal rate and regular rhythm.  Pulmonary:     Effort: Pulmonary effort is normal.     Breath sounds: Normal breath sounds.  Chest:  Breasts:    Right: Normal. No swelling, mass, nipple discharge, skin change or tenderness.     Left: Normal. No swelling, mass, nipple discharge, skin change or tenderness.  Abdominal:     General: Abdomen is flat. Bowel sounds are normal.     Palpations: Abdomen is soft.     Tenderness: There is no abdominal tenderness. There is no rebound.  Genitourinary:    Pubic Area: No rash or pubic lice.      Labia:        Right: No rash, tenderness or lesion.        Left: No rash, tenderness or lesion.      Vagina: Normal. No vaginal discharge, erythema, tenderness or lesions.     Cervix: No cervical motion tenderness, discharge, lesion or erythema.     Uterus: Normal.      Adnexa:        Right: No tenderness.         Left: No tenderness.       Rectum: Normal.     Comments: Amount Discharge: small  Odor: No pH: less than 4.5 Adheres to vaginal wall: No Color: color of discharge matches the Matteo Banke swab  Musculoskeletal:     Cervical back: Full passive range of motion without pain and normal range of motion.  Lymphadenopathy:     Cervical: No cervical adenopathy.     Right cervical: No superficial, deep or posterior cervical adenopathy.    Left cervical: No superficial, deep or posterior cervical adenopathy.     Upper Body:     Right upper body: No supraclavicular, axillary or epitrochlear adenopathy.     Left upper body: No supraclavicular, axillary or epitrochlear adenopathy.     Lower Body: No right inguinal adenopathy. No left inguinal adenopathy.  Skin:    General: Skin is warm and dry.     Findings: No erythema, laceration, lesion or rash.  Neurological:     Mental Status: She is alert and oriented to person, place, and time.     Cranial Nerves: No  cranial nerve deficit.     Gait: Gait normal.     Deep Tendon Reflexes: Reflexes normal.  Psychiatric:        Attention and Perception: Attention normal.        Mood and Affect: Mood normal.        Speech: Speech normal.        Behavior: Behavior normal. Behavior is cooperative.       Assessment and Plan:  Sarah Mooney is a 36 y.o. female presenting to the [facility] Department for an initial annual  wellness/contraceptive visit  Contraception counseling: Reviewed options based on patient desire and reproductive life plan. Patient is interested in No Method - Other Reason.   Risks, benefits, and typical effectiveness rates were reviewed.  Questions were answered.  Written information was also given to the patient to review.    The patient will follow up in  1 years for surveillance.  The patient was told to call with any further questions, or with any concerns about this method of contraception.  Emphasized use of condoms 100% of the time for STI prevention.  Need for ECP was assessed. No ECP needed at this time.   1. Family planning counseling -36  year old female in clinic today for a physical and STD screening. -ROS reviewed, patient complains of discomfort with urination and frequency.  Advised STD screening and to follow up with PCP or Urgent Care for testing of a UTI.  Patient also advised to increase water intake, wipe front to back after urination.  Discussed drinking cranberry juice, urinating immediately when urge occurs, and cleaning and using separate toys after use. -Patient not interested in a birth control method.   2. Screening examination for venereal disease -STD screening. -Patient accepted all screenings including oral, vaginal CT/GC, wet prep and declines bloodwork for HIV/RPR.  Patient meets criteria for HepB screening? Yes. Ordered? No - refused Patient meets criteria for HepC screening? Yes. Ordered? No - refused  Treat wet prep per standing  order Discussed time line for [facility] results and that patient will be called with positive results and encouraged patient to call if she had not heard in 2 weeks.  Counseled to return or seek care for continued or worsening symptoms Recommended condom use with all sex  Patient is currently not using  contraception  to prevent pregnancy.    - Chlamydia/Gonorrhea [place] [facility] - Chlamydia/Gonorrhea [place] [facility] - WET PREP FOR TRICH, YEAST, CLUE  3. Well woman exam with routine gynecological exam -Normal well woman exam. -CBE today, next due 06/2025 -PAP due 03/2025  4. Bacterial vaginosis -Wet prep reviewed, treat patient for BV. - metroNIDAZOLE (FLAGYL) 500 MG tablet; Take 1 tablet (500 mg total) by mouth 2 (two) times daily.  Dispense: 14 tablet; Refill: 0   Total time spent: 30 minutes   Return in about 1 year (around 07/05/2023) for Annual well-woman exam.    Glenna Fellows, FNP

## 2022-07-05 NOTE — Progress Notes (Signed)
Pt here for PE.  Wet mount results reviewed.  The patient was dispensed Metronidazole 500 mg #14 today. I provided counseling today regarding the medication. We discussed the medication, the side effects and when to call clinic. Patient given the opportunity to ask questions. Questions answered.  Pt given FP packet.  Berdie Ogren, RN

## 2022-07-17 ENCOUNTER — Ambulatory Visit
Admission: RE | Admit: 2022-07-17 | Discharge: 2022-07-17 | Disposition: A | Discharge status: Home / Self Care | Payer: Self-pay | Source: Ambulatory Visit | Attending: Emergency Medicine | Admitting: Emergency Medicine

## 2022-07-17 VITALS — BP 118/80 | HR 100 | Temp 98.1°F | Resp 18 | Ht 62.0 in | Wt 213.0 lb

## 2022-07-17 DIAGNOSIS — R0602 Shortness of breath: Secondary | ICD-10-CM

## 2022-07-17 MED ORDER — ALBUTEROL SULFATE HFA 108 (90 BASE) MCG/ACT IN AERS: 1.0000 | INHALATION_SPRAY | Freq: Four times a day (QID) | RESPIRATORY_TRACT | 0 refills | Status: DC | PRN

## 2022-07-17 NOTE — ED Triage Notes (Signed)
Patient to Urgent Care with complaints of SHOB. Reports that she feels like she has been gasping for air since last Thursday. Worse with exertion but patient reports even when resting it is difficult to catch her breath.  Denies any chest pain or other symptoms. Hx anxiety. Pt in NAD at this time.

## 2022-07-17 NOTE — Discharge Instructions (Addendum)
Go to the emergency department if you have shortness of breath or other concerning symptoms.   Use the albuterol inhaler as directed.  Follow up with your primary care provider.

## 2022-07-17 NOTE — ED Provider Notes (Signed)
[name]    CSN: [id number] Arrival date & time: 07/17/22  1525      History   Chief Complaint Chief Complaint  Patient presents with   Wheezing    Shortness of breath - Entered by patient    HPI Sarah Mooney is a 36 y.o. female.  Patient presents with 1 week history of shortness of breath.  She describes this as feeling like she cannot catch her breath at times.  When this occurs, she treats her symptom with rest.  She denies fever, chills, sore throat, cough, chest pain, vomiting, diarrhea, or other symptoms.  Her medical history includes anxiety disorder.  The history is provided by the patient and medical records.    History reviewed. No pertinent past medical history.  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 No pertinent surgical history.  OB History     Gravida  0   Para  0   Term  0   Preterm  0   AB  0   Living  0      SAB  0   IAB  0   Ectopic  0   Multiple  0   Live Births  0            Home Medications    Prior to Admission medications   Medication Sig Start Date End Date Taking? Authorizing Provider  albuterol (VENTOLIN HFA) 108 (90 Base) MCG/ACT inhaler Inhale 1-2 puffs into the lungs every 6 (six) hours as needed for wheezing or shortness of breath. 07/17/22  Yes Mickie Bail, NP  cyclobenzaprine (FEXMID) 7.5 MG tablet Take 1 tablet (7.5 mg total) by mouth 3 (three) times daily as needed for muscle spasms. Patient not taking: Reported on 07/04/2022 03/13/21   Sharyn Creamer, MD  ibuprofen (ADVIL) 800 MG tablet Take 1 tablet (800 mg total) by mouth every 8 (eight) hours as needed. 08/21/19   Menshew, Charlesetta Ivory, PA-C  metroNIDAZOLE (FLAGYL) 500 MG tablet Take 1 tablet (500 mg total) by mouth 2 (two) times daily. 07/04/22   Glenna Fellows, FNP     Family History Family History  Problem Relation Age of Onset   Diabetes Mother    Hypertension Mother    Heart disease Mother    Heart attack Father    Diabetes Father    Hypertension Father    Kidney disease Father    Cancer Maternal Grandmother    Cancer Maternal Grandfather    Heart attack Paternal Grandmother     Social History Social History   Tobacco Use   Smoking status: Never   Smokeless tobacco: Never  Vaping Use   Vaping Use: Never used  Substance Use Topics   Alcohol use: Yes    Comment: rarely   Drug use: Not Currently    Types: Marijuana     Allergies   Latex   Review of Systems Review of Systems  Constitutional:  Negative for chills and fever.  HENT:  Negative for ear pain and sore throat.   Respiratory:  Positive for shortness of breath. Negative for cough.   Cardiovascular:  Negative for chest pain and palpitations.  Gastrointestinal:  Negative for abdominal pain, diarrhea and vomiting.  Skin:  Negative for color change and rash.  All other systems reviewed and are  negative.    Physical Exam Triage Vital Signs ED Triage Vitals  Enc Vitals Group     BP      Pulse      Resp      Temp      Temp src      SpO2      Weight      Height      Head Circumference      Peak Flow      Pain Score      Pain Loc      Pain Edu?      Excl. in GC?    No data found.  Updated Vital Signs BP 118/80   Pulse 100   Temp 98.1 F (36.7 C)   Resp 18   Ht 5\' 2"  (1.575 m)   Wt 213 lb (96.6 kg)   LMP 04/29/2022 (Approximate)   SpO2 98%   BMI 38.96 kg/m   Visual Acuity Right Eye Distance:   Left Eye Distance:   Bilateral Distance:    Right Eye Near:   Left Eye Near:    Bilateral Near:     Physical Exam Vitals and nursing note reviewed.  Constitutional:      General: She is not in acute distress.    Appearance: She is well-developed. She is not ill-appearing.  HENT:     Right Ear: Tympanic membrane normal.     Left Ear: Tympanic  membrane normal.     Nose: Nose normal.     Mouth/Throat:     Mouth: Mucous membranes are moist.     Pharynx: Oropharynx is clear.  Cardiovascular:     Rate and Rhythm: Normal rate and regular rhythm.     Heart sounds: Normal heart sounds.  Pulmonary:     Effort: Pulmonary effort is normal. No respiratory distress.     Breath sounds: Normal breath sounds. No wheezing.  Musculoskeletal:     Cervical back: Neck supple.  Skin:    General: Skin is warm and dry.  Neurological:     Mental Status: She is alert.  Psychiatric:        Mood and Affect: Mood normal.        Behavior: Behavior normal.      UC Treatments / Results  Labs (all labs ordered are listed, but only abnormal results are displayed) Labs Reviewed - No data to display  EKG   Radiology No results found.  Procedures Procedures (including critical care time)  Medications Ordered in UC Medications - No data to display  Initial Impression / Assessment and Plan / UC Course  I have reviewed the triage vital signs and the nursing notes.  Pertinent labs & imaging results that were available during my care of the patient were reviewed by me and considered in my medical decision making (see chart for details).   Shortness of breath.  No respiratory distress, lungs are clear, O2 sat 98% on room air.  Treating with albuterol inhaler as needed.  Instructed patient to schedule an appointment with her PCP for follow-up.  ED precautions discussed.  Education provided on shortness of breath.  Work note provided per patient request.  She agrees to plan of care.   Final Clinical Impressions(s) / UC Diagnoses   Final diagnoses:  Shortness of breath     Discharge Instructions      Go to the emergency department if you have shortness of breath or other concerning symptoms.   Use the albuterol  inhaler as directed.  Follow up with your primary care provider.        ED Prescriptions     Medication Sig Dispense Auth.  Provider   albuterol (VENTOLIN HFA) 108 (90 Base) MCG/ACT inhaler Inhale 1-2 puffs into the lungs every 6 (six) hours as needed for wheezing or shortness of breath. 18 g Mickie Bail, NP      PDMP not reviewed this encounter.   Mickie Bail, NP 07/17/22 1556

## 2023-07-01 ENCOUNTER — Ambulatory Visit
Admission: RE | Admit: 2023-07-01 | Discharge: 2023-07-01 | Disposition: A | Discharge status: Home / Self Care | Payer: Medicaid Other | Source: Ambulatory Visit | Attending: Family Medicine | Admitting: Family Medicine

## 2023-07-01 VITALS — BP 111/79 | HR 88 | Temp 98.0°F | Resp 19

## 2023-07-01 DIAGNOSIS — U071 COVID-19: Secondary | ICD-10-CM | POA: Diagnosis not present

## 2023-07-01 DIAGNOSIS — Z1152 Encounter for screening for COVID-19: Secondary | ICD-10-CM | POA: Insufficient documentation

## 2023-07-01 DIAGNOSIS — J22 Unspecified acute lower respiratory infection: Secondary | ICD-10-CM | POA: Diagnosis not present

## 2023-07-01 MED ORDER — PROMETHAZINE-DM 6.25-15 MG/5ML PO SYRP: 5.0000 mL | ORAL_SOLUTION | Freq: Four times a day (QID) | ORAL | 0 refills | Status: DC | PRN

## 2023-07-01 MED ORDER — PREDNISONE 20 MG PO TABS: 40.0000 mg | ORAL_TABLET | Freq: Every day | ORAL | 0 refills | Status: DC

## 2023-07-01 NOTE — Discharge Instructions (Signed)
Your COVID 19 results will be available in 24 hours. Negative results are immediately resulted to Mychart. Positive results will receive a follow-up call from our clinic. If symptoms are present, I recommend home quarantine until results are known.  

## 2023-07-01 NOTE — ED Triage Notes (Signed)
Patient to Urgent Care with complaints of sore throat/ sneezing/ headaches/ body aches. Symptoms started three days ago.  Positive home covid test. 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

## 2023-07-01 NOTE — ED Provider Notes (Signed)
[name]    CSN: [id number] Arrival date & time: 07/01/23  1547      History   Chief Complaint Chief Complaint  Patient presents with   Sore Throat    I have been sneezing, a headache and body aches. - Entered by patient    HPI Sarah Mooney is a 37 y.o. female.   HPI Patient  presents with multiple URI symptoms after testing positive for COVID with a home test.  Her employer requires a confirmatory test COVID test. She has history of asthma, denies shortness of breath but has heard herself wheeze.  She has not attempted relief with her albuterol inhaler but has the inhaler at home.  History reviewed. No pertinent past medical history.  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 Authorizing Provider  predniSONE (DELTASONE) 20 MG tablet Take 2 tablets (40 mg total) by mouth daily with breakfast. 07/01/23  Yes Bing Neighbors, NP  promethazine-dextromethorphan (PROMETHAZINE-DM) 6.25-15 MG/5ML syrup Take 5 mLs by mouth 4 (four) times daily as needed for cough. 07/01/23  Yes Bing Neighbors, NP  albuterol (VENTOLIN HFA) 108 (90 Base) MCG/ACT inhaler Inhale 1-2 puffs into the lungs every 6 (six) hours as needed for wheezing or shortness of breath. 07/17/22   Mickie Bail, NP  cyclobenzaprine (FEXMID) 7.5 MG tablet Take 1 tablet (7.5 mg total) by mouth 3 (three) times daily as needed for muscle spasms. Patient not taking: Reported on 07/04/2022 03/13/21    Sharyn Creamer, MD  ibuprofen (ADVIL) 800 MG tablet Take 1 tablet (800 mg total) by mouth every 8 (eight) hours as needed. 08/21/19   Menshew, Charlesetta Ivory, PA-C  metroNIDAZOLE (FLAGYL) 500 MG tablet Take 1 tablet (500 mg total) by mouth 2 (two) times daily. Patient not taking: Reported on 07/01/2023 07/04/22   Glenna Fellows, FNP    Family History Family History  Problem Relation Age of Onset   Diabetes Mother    Hypertension Mother    Heart disease Mother    Heart attack Father    Diabetes Father    Hypertension Father    Kidney disease Father    Cancer Maternal Grandmother    Cancer Maternal Grandfather    Heart attack Paternal Grandmother     Social History Social History   Tobacco Use   Smoking status: Never   Smokeless tobacco: Never  Vaping Use   Vaping status: Never Used  Substance Use Topics   Alcohol use: Yes    Comment: rarely   Drug use: Not Currently    Types: Marijuana     Allergies   Latex   Review of Systems Review of Systems Pertinent negatives listed in HPI  Physical  Exam Triage Vital Signs ED Triage Vitals  Encounter Vitals Group     BP 07/01/23 1636 111/79     Systolic BP Percentile --      Diastolic BP Percentile --      Pulse Rate 07/01/23 1636 88     Resp 07/01/23 1636 19     Temp 07/01/23 1636 98 F (36.7 C)     Temp src --      SpO2 07/01/23 1636 98 %     Weight --      Height --      Head Circumference --      Peak Flow --      Pain Score 07/01/23 1635 9     Pain Loc --      Pain Education --      Exclude from Growth Chart --    No data found.  Updated Vital Signs BP 111/79   Pulse 88   Temp 98 F (36.7 C)   Resp 19   SpO2 98%   Visual Acuity Right Eye Distance:   Left Eye Distance:   Bilateral Distance:    Right Eye Near:   Left Eye Near:    Bilateral Near:     Physical Exam General Appearance:    Alert, cooperative, no distress  HENT:   Normocephalic, ears normal, cervical adenopathy, nasal congestion,  rhinorrhea, erythematous pharynx  Eyes:    PERRL, conjunctiva/corneas clear, EOM's intact       Lungs:     Clear to auscultation bilaterally, respirations unlabored  Heart:    Regular rate and rhythm  Neurologic:   Awake, alert, oriented x 3. No apparent focal neurological           defect.         UC Treatments / Results  Labs (all labs ordered are listed, but only abnormal results are displayed) Labs Reviewed  SARS CORONAVIRUS 2 (TAT 6-24 HRS)    EKG   Radiology No results found.  Procedures Procedures (including critical care time)  Medications Ordered in UC Medications - No data to display  Initial Impression / Assessment and Plan / UC Course  I have reviewed the triage vital signs and the nursing notes.  Pertinent labs & imaging results that were available during my care of the patient were reviewed by me and considered in my medical decision making (see chart for details).    COVID test pending. Symptom management warranted only.  Manage fever with Tylenol and ibuprofen.  Nasal symptoms with over-the-counter antihistamines recommended.  Treatment per discharge medications/discharge instructions.  Resume Albuterol inhaler PRN for wheezing and shortness of breath. Return precautions given. Final Clinical Impressions(s) / UC Diagnoses   Final diagnoses:  Encounter for screening laboratory testing for COVID-19 virus  Lower respiratory tract infection due to COVID-19 virus     Discharge Instructions      Your COVID 19 results will be available in 24 hours. Negative results are immediately resulted to Mychart. Positive results will receive a follow-up call from our clinic. If symptoms are present, I recommend home quarantine until results are known.      ED Prescriptions     Medication Sig Dispense Auth. Provider   predniSONE (DELTASONE) 20 MG tablet Take 2 tablets (40 mg total) by mouth daily with breakfast. 10 tablet Bing Neighbors, NP    promethazine-dextromethorphan (PROMETHAZINE-DM) 6.25-15 MG/5ML syrup Take 5 mLs by mouth 4 (four) times daily as needed for cough. 180 mL Joaquin Courts  S, NP      PDMP not reviewed this encounter.   Bing Neighbors, NP 07/01/23 1750

## 2024-01-19 ENCOUNTER — Other Ambulatory Visit: Payer: Medicaid Other

## 2024-03-12 ENCOUNTER — Other Ambulatory Visit

## 2024-05-31 ENCOUNTER — Ambulatory Visit
Admission: EM | Admit: 2024-05-31 | Discharge: 2024-05-31 | Disposition: A | Discharge status: Home / Self Care | Attending: Emergency Medicine | Admitting: Emergency Medicine

## 2024-05-31 ENCOUNTER — Encounter: Payer: Self-pay | Admitting: Emergency Medicine

## 2024-05-31 ENCOUNTER — Other Ambulatory Visit: Payer: Self-pay

## 2024-05-31 DIAGNOSIS — L500 Allergic urticaria: Secondary | ICD-10-CM | POA: Diagnosis not present

## 2024-05-31 DIAGNOSIS — J039 Acute tonsillitis, unspecified: Secondary | ICD-10-CM | POA: Diagnosis not present

## 2024-05-31 DIAGNOSIS — J029 Acute pharyngitis, unspecified: Secondary | ICD-10-CM | POA: Insufficient documentation

## 2024-05-31 LAB — POCT RAPID STREP A (OFFICE): Rapid Strep A Screen: NEGATIVE

## 2024-05-31 MED ORDER — AMOXICILLIN-POT CLAVULANATE 875-125 MG PO TABS: 1.0000 | ORAL_TABLET | Freq: Two times a day (BID) | ORAL | 0 refills | Status: DC

## 2024-05-31 MED ORDER — LIDOCAINE VISCOUS HCL 2 % MT SOLN: 15.0000 mL | OROMUCOSAL | 0 refills | Status: DC | PRN

## 2024-05-31 NOTE — ED Triage Notes (Signed)
 Patient reports sore throat x 2 days and has raspy voice. Has Taken Tylenol and throat lozenges. Rates pain 10/10.

## 2024-05-31 NOTE — ED Triage Notes (Addendum)
 Seen at [facility] today for sore throat. Dx with tonsillitis. (-) Strept test   Says she took first dose of Augmentin , and Lidocaine  tonight then noticed hives on her face, chest and back.   Intermittent vocal hoarseness but says its normal since she's had a sore throat. No facial swelling, or shob.

## 2024-05-31 NOTE — Discharge Instructions (Signed)
 Your evaluated for sore throat  Rapid strep test is negative, has been sent to the lab to see if bacteria will grow, you will be notified if this occurs  Based on the appearance of your throat on exam you are being empirically placed on antibiotics  Take Augmentin  twice daily for 7 days  For comfort May gargle and spit lidocaine  solution which will provide temporary relief from pain  May take Tylenol and or Motrin  as needed  Attempt salt water gargles, throat lozenges warm liquids and soft foods  Please increase your fluid intake to maintain your hydration until you are able to eat as normal  May follow-up as needed

## 2024-05-31 NOTE — ED Provider Notes (Signed)
 Sarah Mooney    CSN: [id number] Arrival date & time: 05/31/24  1032      History   Chief Complaint Chief Complaint  Patient presents with   Sore Throat    HPI Sarah Mooney is a 38 y.o. female.   Patient presents for evaluation of a sore throat and right-sided ear pain beginning 1 day ago.  Painful to swallow making it difficult to tolerate food but tolerating fluids.  Experiencing intermittent hoarseness.  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  Denies fever, cough or congestion.  Has attempted Tylenol.  History reviewed. No pertinent past medical history.  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 Authorizing Provider  amoxicillin -clavulanate (AUGMENTIN ) 875-125 MG tablet Take 1 tablet by mouth every 12 (twelve) hours. 05/31/24  Yes Legacy Carrender R, NP  lidocaine  (XYLOCAINE ) 2 % solution Use as directed 15 mLs in the mouth or throat every 4 (four) hours as needed. 05/31/24  Yes Emerson Barretto, Shelba SAUNDERS, NP  albuterol  (VENTOLIN  HFA) 108 (90 Base) MCG/ACT inhaler Inhale 1-2 puffs into the lungs every 6 (six) hours as needed for wheezing or shortness of breath. 07/17/22   Corlis Burnard DEL, NP  cyclobenzaprine  (FEXMID ) 7.5 MG tablet Take 1 tablet (7.5 mg total) by mouth 3 (three) times daily as needed for muscle spasms. Patient not taking: Reported on 07/04/2022 03/13/21   Dicky Anes, MD  ibuprofen  (ADVIL ) 800 MG tablet Take 1 tablet (800 mg total) by  mouth every 8 (eight) hours as needed. 08/21/19   Menshew, Candida LULLA Kings, PA-C  metroNIDAZOLE  (FLAGYL ) 500 MG tablet Take 1 tablet (500 mg total) by mouth 2 (two) times daily. Patient not taking: Reported on 07/01/2023 07/04/22   Velvet Moomaw, Ayo, NP  predniSONE  (DELTASONE ) 20 MG tablet Take 2 tablets (40 mg total) by mouth daily with breakfast. 07/01/23   Arloa Suzen RAMAN, NP  promethazine -dextromethorphan (PROMETHAZINE -DM) 6.25-15 MG/5ML syrup Take 5 mLs by mouth 4 (four) times daily as needed for cough. 07/01/23   Arloa Suzen RAMAN, NP    Family History Family History  Problem Relation Age of Onset   Diabetes Mother    Hypertension Mother    Heart disease Mother    Heart attack Father    Diabetes Father    Hypertension Father    Kidney disease Father    Cancer Maternal Grandmother    Cancer Maternal Grandfather    Heart attack Paternal Grandmother     Social History Social History   Tobacco Use   Smoking status: Never   Smokeless tobacco: Never  Vaping Use   Vaping status: Never Used  Substance Use Topics   Alcohol use: Yes    Comment: rarely  Drug use: Not Currently    Types: Marijuana     Allergies   Latex   Review of Systems Review of Systems   Physical Exam Triage Vital Signs ED Triage Vitals  Encounter Vitals Group     BP 05/31/24 1037 121/82     Girls Systolic BP Percentile --      Girls Diastolic BP Percentile --      Boys Systolic BP Percentile --      Boys Diastolic BP Percentile --      Pulse Rate 05/31/24 1037 98     Resp 05/31/24 1037 18     Temp 05/31/24 1037 99.1 F (37.3 C)     Temp Source 05/31/24 1037 Oral     SpO2 05/31/24 1037 97 %     Weight --      Height --      Head Circumference --      Peak Flow --      Pain Score 05/31/24 1041 10     Pain Loc --      Pain Education --      Exclude from Growth Chart --    No data found.  Updated Vital Signs BP 121/82 (BP Location: Left Arm)   Pulse 98   Temp 99.1 F (37.3 C) (Oral)    Resp 18   LMP 05/06/2024 (Exact Date)   SpO2 97%   Visual Acuity Right Eye Distance:   Left Eye Distance:   Bilateral Distance:    Right Eye Near:   Left Eye Near:    Bilateral Near:     Physical Exam Constitutional:      Appearance: She is well-developed.  HENT:     Right Ear: Tympanic membrane and ear canal normal.     Left Ear: Tympanic membrane and ear canal normal.     Nose: No congestion.     Mouth/Throat:     Pharynx: Posterior oropharyngeal erythema present.     Tonsils: Tonsillar exudate present. 3+ on the right. 3+ on the left.  Pulmonary:     Effort: Pulmonary effort is normal.  Musculoskeletal:     Cervical back: Normal range of motion.  Lymphadenopathy:     Cervical: Cervical adenopathy present.  Neurological:     Mental Status: She is alert and oriented to person, place, and time.      UC Treatments / Results  Labs (all labs ordered are listed, but only abnormal results are displayed) Labs Reviewed  POCT RAPID STREP A (OFFICE) - Normal  CULTURE, GROUP A STREP [facility])    EKG   Radiology No results found.  Procedures Procedures (including critical care time)  Medications Ordered in UC Medications - No data to display  Initial Impression / Assessment and Plan / UC Course  I have reviewed the triage vital signs and the nursing notes.  Pertinent labs & imaging results that were available during my care of the patient were reviewed by me and considered in my medical decision making (see chart for details).  Acute tonsillitis  Vital signs are stable, patient in no signs of distress nontoxic-appearing, rapid strep test negative, sent for culture and empirically placed on Augmentin  as there is tonsillar adenopathy, exudate and erythema within the oropharynx, discussed all findings with patient, additionally prescribed viscous lidocaine  for comfort, recommend over-the-counter analgesics and nonpharmacological supportive care with follow-up with urgent  care as needed, work note given Final Clinical Impressions(s) / UC Diagnoses   Final diagnoses:  Acute tonsillitis,  unspecified etiology     Discharge Instructions      Your evaluated for sore throat  Rapid strep test is negative, has been sent to the lab to see if bacteria will grow, you will be notified if this occurs  Based on the appearance of your throat on exam you are being empirically placed on antibiotics  Take Augmentin  twice daily for 7 days  For comfort May gargle and spit lidocaine  solution which will provide temporary relief from pain  May take Tylenol and or Motrin  as needed  Attempt salt water gargles, throat lozenges warm liquids and soft foods  Please increase your fluid intake to maintain your hydration until you are able to eat as normal  May follow-up as needed   ED Prescriptions     Medication Sig Dispense Auth. Provider   amoxicillin -clavulanate (AUGMENTIN ) 875-125 MG tablet Take 1 tablet by mouth every 12 (twelve) hours. 14 tablet Rozelle Caudle R, NP   lidocaine  (XYLOCAINE ) 2 % solution Use as directed 15 mLs in the mouth or throat every 4 (four) hours as needed. 100 mL Teresa Shelba SAUNDERS, NP      PDMP not reviewed this encounter.   Teresa Shelba SAUNDERS, NP 05/31/24 1137

## 2024-06-01 ENCOUNTER — Emergency Department
Admission: EM | Admit: 2024-06-01 | Discharge: 2024-06-01 | Disposition: A | Discharge status: Home / Self Care | Attending: Emergency Medicine | Admitting: Emergency Medicine

## 2024-06-01 DIAGNOSIS — T7840XA Allergy, unspecified, initial encounter: Secondary | ICD-10-CM

## 2024-06-01 DIAGNOSIS — L509 Urticaria, unspecified: Secondary | ICD-10-CM

## 2024-06-01 DIAGNOSIS — J029 Acute pharyngitis, unspecified: Secondary | ICD-10-CM

## 2024-06-01 MED ORDER — AZITHROMYCIN 250 MG PO TABS: ORAL_TABLET | ORAL | 0 refills | Status: AC

## 2024-06-01 MED ORDER — DEXAMETHASONE 6 MG PO TABS
10.0000 mg | ORAL_TABLET | Freq: Once | ORAL | Status: AC
  Administered 2024-06-01: 10 mg via ORAL
  Filled 2024-06-01: qty 1

## 2024-06-01 NOTE — Discharge Instructions (Signed)
 Take azithromycin  as prescribed.  Stop the lidocaine  and Augmentin . Take acetaminophen 650 mg and ibuprofen  400 mg every 6 hours for pain.  Take with food.  If your sore throat continues over the next couple of days and does not get better call the ENT doctor at the number provided.  Thank you for choosing us  for your health care today!  Please see your primary doctor this week for a follow up appointment.   If you have any new, worsening, or unexpected symptoms call your doctor right away or come back to the emergency department for reevaluation.  It was my pleasure to care for you today.   Ginnie EDISON Cyrena, MD

## 2024-06-01 NOTE — ED Provider Notes (Signed)
 [facility] Provider Note    Event Date/Time   First MD Initiated Contact with Patient 06/01/24 0301     (approximate)   History   Allergic Reaction and Sore Throat   HPI  Sarah Mooney is a 38 y.o. female   Past medical history of no significant past medical history who has had a sore throat for the last several days and was seen in urgent care today and prescribed Augmentin  and lidocaine  and after taking these new medications she developed hives on her torso.  They are itchy.  She did not take any medications and these have resolved spontaneously.  No other systems involvement like difficulty breathing, GI symptoms.  She believes she took amoxicillin  as a child and had no poor reactions to this medication.  She denies any other new exposures this evening.   Independent Historian contributed to assessment above: Her family member is at bedside to corroborate information past medical history is above  External Medical Documents Reviewed: Urgent care note from earlier today with acute tonsillitis      Physical Exam   Triage Vital Signs: ED Triage Vitals  Encounter Vitals Group     BP 05/31/24 2236 130/69     Girls Systolic BP Percentile --      Girls Diastolic BP Percentile --      Boys Systolic BP Percentile --      Boys Diastolic BP Percentile --      Pulse Rate 05/31/24 2236 (!) 119     Resp 05/31/24 2236 20     Temp 05/31/24 2236 99.3 F (37.4 C)     Temp Source 06/01/24 0314 Oral     SpO2 05/31/24 2236 96 %     Weight 05/31/24 2236 210 lb (95.3 kg)     Height 05/31/24 2236 5' 2 (1.575 m)     Head Circumference --      Peak Flow --      Pain Score 05/31/24 2236 10     Pain Loc --      Pain Education --      Exclude from Growth Chart --     Most recent vital signs: Vitals:   06/01/24 0314 06/01/24 0326  BP: 121/82   Pulse: 96   Resp: 16   Temp: 97.6 F (36.4 C)   SpO2: 100% 100%    General: Awake, no distress.   CV:  Good peripheral perfusion.  Resp:  Normal effort.  Abd:  No distention.  Other:  She has a hoarse voice.  Bilateral tonsillar swelling may be a little bit greater on the right side.  No uvular deviation.  Airway intact.  No hives on the body on my examination.  Clear lungs.  Soft nontender abdomen.   ED Results / Procedures / Treatments   Labs (all labs ordered are listed, but only abnormal results are displayed) Labs Reviewed - No data to display    PROCEDURES:  Critical Care performed: No  Procedures   MEDICATIONS ORDERED IN ED: Medications  dexamethasone  (DECADRON ) tablet 10 mg (has no administration in time range)    IMPRESSION / MDM / ASSESSMENT AND PLAN / ED COURSE  I reviewed the triage vital signs and the nursing notes.                                Patient's presentation is most consistent with acute presentation with potential  threat to life or bodily function.  Differential diagnosis includes, but is not limited to, tonsillitis, abscess, deep space neck infection, allergic reaction   The patient is on the cardiac monitor to evaluate for evidence of arrhythmia and/or significant heart rate changes.  MDM:    Looks like she got a allergic reaction hives that is spontaneously resolved probably one of her new medications today lidocaine  or Augmentin .  I will change her over to azithromycin .  Advised not to use lidocaine  anymore.  Tonsillitis on clinical exam without airway obstruction, will give dexamethasone  for symptomatic improvement, and switch her over to antibiotics as above, and I gave her the number to call ENT in the morning for reevaluation as needed.        FINAL CLINICAL IMPRESSION(S) / ED DIAGNOSES   Final diagnoses:  Sore throat  Hives  Allergic reaction, initial encounter     Rx / DC Orders   ED Discharge Orders          Ordered    azithromycin  (ZITHROMAX  Z-PAK) 250 MG tablet        06/01/24 0433             Note:   This document was prepared using Dragon voice recognition software and may include unintentional dictation errors.    Sarah Mylar, MD 06/01/24 [phone]

## 2024-06-02 LAB — CULTURE, GROUP A STREP (THRC)

## 2024-06-03 ENCOUNTER — Ambulatory Visit (HOSPITAL_COMMUNITY): Payer: Self-pay

## 2024-07-19 ENCOUNTER — Encounter (HOSPITAL_BASED_OUTPATIENT_CLINIC_OR_DEPARTMENT_OTHER): Payer: Self-pay

## 2024-07-19 ENCOUNTER — Other Ambulatory Visit: Payer: Self-pay

## 2024-07-19 ENCOUNTER — Emergency Department (HOSPITAL_BASED_OUTPATIENT_CLINIC_OR_DEPARTMENT_OTHER)
Admission: EM | Admit: 2024-07-19 | Discharge: 2024-07-19 | Disposition: A | Discharge status: Home / Self Care | Attending: Emergency Medicine | Admitting: Emergency Medicine

## 2024-07-19 DIAGNOSIS — M546 Pain in thoracic spine: Secondary | ICD-10-CM | POA: Insufficient documentation

## 2024-07-19 DIAGNOSIS — Y92410 Unspecified street and highway as the place of occurrence of the external cause: Secondary | ICD-10-CM | POA: Insufficient documentation

## 2024-07-19 MED ORDER — IBUPROFEN 600 MG PO TABS: 600.0000 mg | ORAL_TABLET | Freq: Four times a day (QID) | ORAL | 0 refills | Status: DC | PRN

## 2024-07-19 MED ORDER — CYCLOBENZAPRINE HCL 10 MG PO TABS: 10.0000 mg | ORAL_TABLET | Freq: Two times a day (BID) | ORAL | 0 refills | Status: DC | PRN

## 2024-07-19 MED ORDER — IBUPROFEN 800 MG PO TABS
800.0000 mg | ORAL_TABLET | Freq: Once | ORAL | Status: AC
  Administered 2024-07-19: 800 mg via ORAL
  Filled 2024-07-19: qty 1
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
## 2024-07-19 NOTE — ED Notes (Signed)
 Discharge instructions reviewed.   Newly prescribed medications discussed. Pharmacy updated and medications re-sent to preferred pharmacy.   Opportunity for questions and concerns provided.   Alert, oriented and ambulatory.   Displays no signs of distress.

## 2024-12-20 ENCOUNTER — Ambulatory Visit
Admission: EM | Admit: 2024-12-20 | Discharge: 2024-12-20 | Disposition: A | Discharge status: Home / Self Care | Source: Home / Self Care | Attending: Emergency Medicine | Admitting: Emergency Medicine

## 2024-12-20 DIAGNOSIS — J0140 Acute pansinusitis, unspecified: Secondary | ICD-10-CM | POA: Diagnosis not present

## 2024-12-20 DIAGNOSIS — R0982 Postnasal drip: Secondary | ICD-10-CM

## 2024-12-20 DIAGNOSIS — R051 Acute cough: Secondary | ICD-10-CM | POA: Diagnosis not present

## 2024-12-20 MED ORDER — ALBUTEROL SULFATE HFA 108 (90 BASE) MCG/ACT IN AERS: 2.0000 | INHALATION_SPRAY | RESPIRATORY_TRACT | 0 refills | Status: AC | PRN

## 2024-12-20 MED ORDER — IPRATROPIUM BROMIDE 0.06 % NA SOLN: 2.0000 | Freq: Four times a day (QID) | NASAL | 0 refills | Status: AC

## 2024-12-20 MED ORDER — DOXYCYCLINE HYCLATE 100 MG PO CAPS: 100.0000 mg | ORAL_CAPSULE | Freq: Two times a day (BID) | ORAL | 0 refills | Status: AC

## 2024-12-20 MED ORDER — PROMETHAZINE-DM 6.25-15 MG/5ML PO SYRP: 5.0000 mL | ORAL_SOLUTION | Freq: Four times a day (QID) | ORAL | 0 refills | Status: AC | PRN

## 2024-12-20 MED ORDER — AEROCHAMBER MV MISC: 1 refills | Status: AC

## 2024-12-20 MED ORDER — IBUPROFEN 600 MG PO TABS: 600.0000 mg | ORAL_TABLET | Freq: Four times a day (QID) | ORAL | 0 refills | Status: AC | PRN

## 2024-12-20 NOTE — ED Triage Notes (Addendum)
 Sx x 2-3 weeks  Non productive cough Headache that comes and goes No fever  Sore throat

## 2024-12-20 NOTE — Discharge Instructions (Signed)
 Start Mucinex-D to keep the mucous thin and to decongest you.   Stop other cold medications.  You may take 600 mg of motrin  with 1000 mg of tylenol up to 3-4 times a day as needed for pain. This is an effective combination for pain.  Finish the doxycycline , even if you feel better.  Use a NeilMed sinus rinse with distilled water as often as you want to to reduce nasal congestion. Follow the directions on the box.  Promethazine  DM for cough, Atrovent  nasal spray for nasal congestion, postnasal drip.     2 puffs from albuterol  inhaler with spacer every 4-6 hours.  Have the pharmacist show you how to use the inhaler with a spacer.  Go to www.goodrx.com to look up your medications. This will give you a list of where you can find your prescriptions at the most affordable prices. Or you can ask the pharmacist what the cash price is. This is frequently cheaper than going through insurance.
